# Patient Record
Sex: Female | Born: 1950
Health system: Southern US, Community
[De-identification: ages and names within clinical notes are randomized; demographics above are authoritative.]

## PROBLEM LIST (undated history)

## (undated) DIAGNOSIS — C801 Malignant (primary) neoplasm, unspecified: Secondary | ICD-10-CM

## (undated) DIAGNOSIS — I10 Essential (primary) hypertension: Secondary | ICD-10-CM

## (undated) DIAGNOSIS — D649 Anemia, unspecified: Secondary | ICD-10-CM

## (undated) HISTORY — DX: Essential (primary) hypertension: I10

---

## 1999-02-28 ENCOUNTER — Emergency Department (HOSPITAL_COMMUNITY): Admission: EM | Admit: 1999-02-28 | Discharge: 1999-02-28 | Payer: Self-pay | Admitting: Emergency Medicine

## 2001-09-28 ENCOUNTER — Encounter: Payer: Self-pay | Admitting: Emergency Medicine

## 2001-09-28 ENCOUNTER — Emergency Department (HOSPITAL_COMMUNITY): Admission: EM | Admit: 2001-09-28 | Discharge: 2001-09-28 | Payer: Self-pay | Admitting: Emergency Medicine

## 2002-04-07 ENCOUNTER — Encounter: Admission: RE | Admit: 2002-04-07 | Discharge: 2002-04-07 | Payer: Self-pay | Admitting: *Deleted

## 2002-05-11 ENCOUNTER — Ambulatory Visit (HOSPITAL_COMMUNITY): Admission: RE | Admit: 2002-05-11 | Discharge: 2002-05-11 | Payer: Self-pay | Admitting: *Deleted

## 2002-08-07 ENCOUNTER — Encounter: Payer: Self-pay | Admitting: Emergency Medicine

## 2002-08-07 ENCOUNTER — Emergency Department (HOSPITAL_COMMUNITY): Admission: EM | Admit: 2002-08-07 | Discharge: 2002-08-07 | Payer: Self-pay | Admitting: Emergency Medicine

## 2003-07-14 ENCOUNTER — Emergency Department (HOSPITAL_COMMUNITY): Admission: EM | Admit: 2003-07-14 | Discharge: 2003-07-14 | Payer: Self-pay | Admitting: Emergency Medicine

## 2006-06-18 ENCOUNTER — Ambulatory Visit (HOSPITAL_COMMUNITY): Admission: RE | Admit: 2006-06-18 | Discharge: 2006-06-18 | Payer: Self-pay | Admitting: Family Medicine

## 2007-02-01 ENCOUNTER — Emergency Department (HOSPITAL_COMMUNITY): Admission: EM | Admit: 2007-02-01 | Discharge: 2007-02-01 | Payer: Self-pay | Admitting: Emergency Medicine

## 2007-06-20 ENCOUNTER — Ambulatory Visit (HOSPITAL_COMMUNITY): Admission: RE | Admit: 2007-06-20 | Discharge: 2007-06-20 | Payer: Self-pay | Admitting: Family Medicine

## 2007-07-12 ENCOUNTER — Emergency Department (HOSPITAL_COMMUNITY): Admission: EM | Admit: 2007-07-12 | Discharge: 2007-07-12 | Payer: Self-pay | Admitting: Emergency Medicine

## 2007-09-11 ENCOUNTER — Encounter (INDEPENDENT_AMBULATORY_CARE_PROVIDER_SITE_OTHER): Payer: Self-pay | Admitting: Nurse Practitioner

## 2007-09-11 LAB — CONVERTED CEMR LAB: Pap Smear: NEGATIVE

## 2007-09-22 ENCOUNTER — Ambulatory Visit: Payer: Self-pay | Admitting: Nurse Practitioner

## 2007-09-22 DIAGNOSIS — I10 Essential (primary) hypertension: Secondary | ICD-10-CM | POA: Insufficient documentation

## 2007-09-22 DIAGNOSIS — J309 Allergic rhinitis, unspecified: Secondary | ICD-10-CM | POA: Insufficient documentation

## 2007-09-23 ENCOUNTER — Ambulatory Visit: Payer: Self-pay | Admitting: *Deleted

## 2007-10-13 ENCOUNTER — Encounter (INDEPENDENT_AMBULATORY_CARE_PROVIDER_SITE_OTHER): Payer: Self-pay | Admitting: Nurse Practitioner

## 2007-10-14 ENCOUNTER — Encounter (INDEPENDENT_AMBULATORY_CARE_PROVIDER_SITE_OTHER): Payer: Self-pay | Admitting: Nurse Practitioner

## 2007-10-14 LAB — CONVERTED CEMR LAB
Cholesterol: 182 mg/dL
Total CHOL/HDL Ratio: 3.1
Triglycerides: 69 mg/dL

## 2007-10-16 ENCOUNTER — Ambulatory Visit: Payer: Self-pay | Admitting: Nurse Practitioner

## 2007-10-16 LAB — CONVERTED CEMR LAB
Bilirubin Urine: NEGATIVE
Ketones, urine, test strip: NEGATIVE
Specific Gravity, Urine: 1.015

## 2007-10-17 ENCOUNTER — Encounter (INDEPENDENT_AMBULATORY_CARE_PROVIDER_SITE_OTHER): Payer: Self-pay | Admitting: Nurse Practitioner

## 2007-10-21 ENCOUNTER — Encounter (INDEPENDENT_AMBULATORY_CARE_PROVIDER_SITE_OTHER): Payer: Self-pay | Admitting: Nurse Practitioner

## 2007-10-28 ENCOUNTER — Encounter (INDEPENDENT_AMBULATORY_CARE_PROVIDER_SITE_OTHER): Payer: Self-pay | Admitting: Nurse Practitioner

## 2007-10-30 ENCOUNTER — Telehealth (INDEPENDENT_AMBULATORY_CARE_PROVIDER_SITE_OTHER): Payer: Self-pay | Admitting: Nurse Practitioner

## 2008-03-22 ENCOUNTER — Ambulatory Visit: Payer: Self-pay | Admitting: Nurse Practitioner

## 2008-03-22 DIAGNOSIS — R609 Edema, unspecified: Secondary | ICD-10-CM

## 2008-03-24 ENCOUNTER — Encounter (INDEPENDENT_AMBULATORY_CARE_PROVIDER_SITE_OTHER): Payer: Self-pay | Admitting: Nurse Practitioner

## 2008-03-24 DIAGNOSIS — D649 Anemia, unspecified: Secondary | ICD-10-CM

## 2008-03-24 DIAGNOSIS — D560 Alpha thalassemia: Secondary | ICD-10-CM | POA: Insufficient documentation

## 2008-03-24 LAB — CONVERTED CEMR LAB: Retic Ct Pct: 1.3 % (ref 0.4–3.1)

## 2008-03-25 ENCOUNTER — Encounter (INDEPENDENT_AMBULATORY_CARE_PROVIDER_SITE_OTHER): Payer: Self-pay | Admitting: Nurse Practitioner

## 2008-03-25 LAB — CONVERTED CEMR LAB
Albumin: 4.5 g/dL (ref 3.5–5.2)
Alkaline Phosphatase: 40 units/L (ref 39–117)
BUN: 21 mg/dL (ref 6–23)
CO2: 23 meq/L (ref 19–32)
Calcium: 9.5 mg/dL (ref 8.4–10.5)
Eosinophils Absolute: 0.1 10*3/uL (ref 0.0–0.7)
Glucose, Bld: 90 mg/dL (ref 70–99)
HCT: 28.8 % — ABNORMAL LOW (ref 36.0–46.0)
Lymphocytes Relative: 37 % (ref 12–46)
Lymphs Abs: 1.8 10*3/uL (ref 0.7–4.0)
MCV: 55.4 fL — ABNORMAL LOW (ref 78.0–100.0)
Monocytes Relative: 9 % (ref 3–12)
Neutrophils Relative %: 53 % (ref 43–77)
Platelets: 255 10*3/uL (ref 150–400)
Potassium: 4.6 meq/L (ref 3.5–5.3)
RBC: 5.2 M/uL — ABNORMAL HIGH (ref 3.87–5.11)
Sodium: 136 meq/L (ref 135–145)
TSH: 1.262 microintl units/mL (ref 0.350–4.50)
Total Protein: 7.5 g/dL (ref 6.0–8.3)
WBC: 4.8 10*3/uL (ref 4.0–10.5)

## 2008-04-02 ENCOUNTER — Encounter (INDEPENDENT_AMBULATORY_CARE_PROVIDER_SITE_OTHER): Payer: Self-pay | Admitting: *Deleted

## 2008-04-06 ENCOUNTER — Ambulatory Visit: Payer: Self-pay | Admitting: Nurse Practitioner

## 2008-04-06 DIAGNOSIS — J209 Acute bronchitis, unspecified: Secondary | ICD-10-CM | POA: Insufficient documentation

## 2008-04-06 LAB — CONVERTED CEMR LAB
Basophils Absolute: 0 10*3/uL (ref 0.0–0.1)
Eosinophils Relative: 3 % (ref 0–5)
HCT: 28.2 % — ABNORMAL LOW (ref 36.0–46.0)
Lymphocytes Relative: 37 % (ref 12–46)
Lymphs Abs: 1.8 10*3/uL (ref 0.7–4.0)
Neutro Abs: 2.6 10*3/uL (ref 1.7–7.7)
Neutrophils Relative %: 53 % (ref 43–77)
Platelets: 269 10*3/uL (ref 150–400)
RDW: 24 % — ABNORMAL HIGH (ref 11.5–15.5)
WBC: 4.8 10*3/uL (ref 4.0–10.5)

## 2008-04-07 ENCOUNTER — Encounter (INDEPENDENT_AMBULATORY_CARE_PROVIDER_SITE_OTHER): Payer: Self-pay | Admitting: Nurse Practitioner

## 2008-05-07 ENCOUNTER — Telehealth (INDEPENDENT_AMBULATORY_CARE_PROVIDER_SITE_OTHER): Payer: Self-pay | Admitting: Nurse Practitioner

## 2008-06-21 ENCOUNTER — Ambulatory Visit (HOSPITAL_COMMUNITY): Admission: RE | Admit: 2008-06-21 | Discharge: 2008-06-21 | Payer: Self-pay | Admitting: Family Medicine

## 2008-07-14 ENCOUNTER — Telehealth (INDEPENDENT_AMBULATORY_CARE_PROVIDER_SITE_OTHER): Payer: Self-pay | Admitting: Nurse Practitioner

## 2008-07-20 ENCOUNTER — Encounter (INDEPENDENT_AMBULATORY_CARE_PROVIDER_SITE_OTHER): Payer: Self-pay | Admitting: *Deleted

## 2008-08-05 ENCOUNTER — Ambulatory Visit: Payer: Self-pay | Admitting: Nurse Practitioner

## 2008-08-05 DIAGNOSIS — R21 Rash and other nonspecific skin eruption: Secondary | ICD-10-CM | POA: Insufficient documentation

## 2008-08-09 ENCOUNTER — Encounter (INDEPENDENT_AMBULATORY_CARE_PROVIDER_SITE_OTHER): Payer: Self-pay | Admitting: Nurse Practitioner

## 2008-08-09 LAB — CONVERTED CEMR LAB
HCT: 29.5 % — ABNORMAL LOW (ref 36.0–46.0)
MCV: 55.5 fL — ABNORMAL LOW (ref 78.0–100.0)
Platelets: 162 10*3/uL (ref 150–400)
RBC: 5.32 M/uL — ABNORMAL HIGH (ref 3.87–5.11)
WBC: 4.1 10*3/uL (ref 4.0–10.5)

## 2008-09-02 ENCOUNTER — Encounter (INDEPENDENT_AMBULATORY_CARE_PROVIDER_SITE_OTHER): Payer: Self-pay | Admitting: Nurse Practitioner

## 2009-06-22 ENCOUNTER — Ambulatory Visit (HOSPITAL_COMMUNITY): Admission: RE | Admit: 2009-06-22 | Discharge: 2009-06-22 | Payer: Self-pay | Admitting: Family Medicine

## 2009-06-22 ENCOUNTER — Encounter (INDEPENDENT_AMBULATORY_CARE_PROVIDER_SITE_OTHER): Payer: Self-pay | Admitting: Nurse Practitioner

## 2009-07-11 ENCOUNTER — Encounter: Admission: RE | Admit: 2009-07-11 | Discharge: 2009-07-11 | Payer: Self-pay | Admitting: Family Medicine

## 2009-07-11 ENCOUNTER — Encounter (INDEPENDENT_AMBULATORY_CARE_PROVIDER_SITE_OTHER): Payer: Self-pay | Admitting: Nurse Practitioner

## 2009-07-12 ENCOUNTER — Encounter (INDEPENDENT_AMBULATORY_CARE_PROVIDER_SITE_OTHER): Payer: Self-pay | Admitting: Nurse Practitioner

## 2009-07-15 ENCOUNTER — Encounter (INDEPENDENT_AMBULATORY_CARE_PROVIDER_SITE_OTHER): Payer: Self-pay | Admitting: Nurse Practitioner

## 2009-08-12 ENCOUNTER — Ambulatory Visit: Payer: Self-pay | Admitting: Nurse Practitioner

## 2009-09-16 ENCOUNTER — Ambulatory Visit: Payer: Self-pay | Admitting: Nurse Practitioner

## 2009-09-16 ENCOUNTER — Other Ambulatory Visit: Admission: RE | Admit: 2009-09-16 | Discharge: 2009-09-16 | Payer: Self-pay | Admitting: Internal Medicine

## 2009-09-22 ENCOUNTER — Encounter (INDEPENDENT_AMBULATORY_CARE_PROVIDER_SITE_OTHER): Payer: Self-pay | Admitting: Nurse Practitioner

## 2010-01-18 ENCOUNTER — Ambulatory Visit: Payer: Self-pay | Admitting: Nurse Practitioner

## 2010-01-18 DIAGNOSIS — M25569 Pain in unspecified knee: Secondary | ICD-10-CM | POA: Insufficient documentation

## 2010-06-21 ENCOUNTER — Ambulatory Visit: Payer: Self-pay | Admitting: Nurse Practitioner

## 2010-06-23 ENCOUNTER — Encounter (INDEPENDENT_AMBULATORY_CARE_PROVIDER_SITE_OTHER): Payer: Self-pay | Admitting: Nurse Practitioner

## 2010-06-23 DIAGNOSIS — M21619 Bunion of unspecified foot: Secondary | ICD-10-CM

## 2010-06-27 ENCOUNTER — Ambulatory Visit (HOSPITAL_COMMUNITY)
Admission: RE | Admit: 2010-06-27 | Discharge: 2010-06-27 | Payer: Self-pay | Source: Home / Self Care | Attending: Internal Medicine | Admitting: Internal Medicine

## 2010-07-23 ENCOUNTER — Encounter: Payer: Self-pay | Admitting: Family Medicine

## 2010-07-30 LAB — CONVERTED CEMR LAB
AST: 11 units/L (ref 0–37)
Alkaline Phosphatase: 47 units/L (ref 39–117)
BUN: 21 mg/dL (ref 6–23)
Basophils Absolute: 0 10*3/uL (ref 0.0–0.1)
Creatinine, Ser: 0.83 mg/dL (ref 0.40–1.20)
Eosinophils Absolute: 0.1 10*3/uL (ref 0.0–0.7)
Eosinophils Relative: 2 % (ref 0–5)
GC Probe Amp, Genital: NEGATIVE
Glucose, Urine, Semiquant: NEGATIVE
HCT: 31.8 % — ABNORMAL LOW (ref 36.0–46.0)
HDL: 50 mg/dL (ref >39)
KOH Prep: NEGATIVE
Ketones, urine, test strip: NEGATIVE
LDL Cholesterol: 130 mg/dL — ABNORMAL HIGH (ref 0–99)
MCV: 54.3 fL — ABNORMAL LOW (ref 78.0–100.0)
Neutrophils Relative %: 52 % (ref 43–77)
Nitrite: NEGATIVE
OCCULT 1: NEGATIVE
Platelets: 172 10*3/uL (ref 150–400)
Protein, U semiquant: NEGATIVE
RDW: 25.1 % — ABNORMAL HIGH (ref 11.5–15.5)
Rapid HIV Screen: NEGATIVE
Specific Gravity, Urine: >=1.03
TSH: 2.299 microintl units/mL (ref 0.350–4.500)
Total Bilirubin: 1.2 mg/dL (ref 0.3–1.2)
Total CHOL/HDL Ratio: 3.9
VLDL: 14 mg/dL (ref 0–40)
pH: 5

## 2010-08-01 ENCOUNTER — Telehealth (INDEPENDENT_AMBULATORY_CARE_PROVIDER_SITE_OTHER): Payer: Self-pay | Admitting: Nurse Practitioner

## 2010-08-03 NOTE — Assessment & Plan Note (Signed)
Summary: Discoloration of legs   Vital Signs:  Patient profile:   60 year old female Menstrual status:  postmenopausal Weight:      246.6 pounds BMI:     42.48 BSA:     2.14 Temp:     98.3 degrees F oral Pulse rate:   58 / minute Pulse rhythm:   regular Resp:     16 per minute BP sitting:   130 / 77  (left arm) Cuff size:   regular  Vitals Entered By: Levon Hedger (August 12, 2009 3:58 PM) CC: both feet burning and she says that she needs some fluid pill....discoloration on legs Is Patient Diabetic? No Pain Assessment Patient in pain? no       Does patient need assistance? Functional Status Self care Ambulation Normal     Menstrual Status postmenopausal Last PAP Result negative   CC:  both feet burning and she says that she needs some fluid pill....discoloration on legs.  History of Present Illness:  Pt into the office with complaints of her legs being discolored. Upon review of chart pt had this same problems this very time last year. She reported that problem self resolved after 2-3 weeks after his visit here. Discoloration returned about 1 month ago. No pain slight itching behind her knee Some edema at baseline but this is normal for pt Left leg is only affected at this time.  admits that she sits very close to her kerosone heater  and the source is directly pointed on her left side Denies any trauma to her legs  Anticoagulation Management History:      Positive risk factors for bleeding include presence of serious comorbidities.  Negative risk factors for bleeding include an age less than 54 years old and no history of CVA/TIA.  The bleeding index is 'intermediate risk'.  Positive CHADS2 values include History of HTN.  Negative CHADS2 values include Age > 33 years old and Prior Stroke/CVA/TIA.     Habits & Providers  Alcohol-Tobacco-Diet     Alcohol drinks/day: 0     Tobacco Status: never  Exercise-Depression-Behavior     Does Patient Exercise:  no     Have you felt down or hopeless? no     Have you felt little pleasure in things? no     Depression Counseling: not indicated; screening negative for depression     Drug Use: no     Seat Belt Use: 100     Sun Exposure: occasionally  Allergies (verified): No Known Drug Allergies  Social History: Does Patient Exercise:  no  Review of Systems CV:  Complains of swelling of feet; denies chest pain or discomfort. Resp:  Denies cough. GI:  Denies abdominal pain, nausea, and vomiting. Derm:  discoloration of legs.  Physical Exam  General:  alert.  obese Head:  normocephalic.   Lungs:  normal breath sounds.   Heart:  normal rate and regular rhythm.   Abdomen:  soft and non-tender.   Msk:  up to the exam table Extremities:  trace left pedal edema.   Neurologic:  alert & oriented X3.   Skin:  left lower extremity - blotches of discoloration blanches when touched Psych:  Oriented X3.     Impression & Recommendations:  Problem # 1:  SKIN RASH (ICD-782.1) advised that rash is likely due to sitting close to the heater advised pt that symptoms will likely resolve if she avoids direct contact with the source  Complete Medication List: 1)  Loratadine 10  Mg Tabs (Loratadine) .Marland Kitchen.. 1 tablet by mouth daily as needed for allergies 2)  Lisinopril-hydrochlorothiazide 20-12.5 Mg Tabs (Lisinopril-hydrochlorothiazide) .Marland Kitchen.. 1 tablet by mouth daily 3)  Furosemide 20 Mg Tabs (Furosemide) .Marland Kitchen.. 1 tablet by mouth daily as needed for fluid 4)  Ferrous Sulfate 325 (65 Fe) Mg Tbec (Ferrous sulfate) .Marland Kitchen.. 1 tablet by mouth two times a day to build up blood 5)  Flonase 50 Mcg/act Susp (Fluticasone propionate) .Marland Kitchen.. 1 spray in each nostril two times a day  Patient Instructions: 1)  Schedule an appointment for a complete physical exam. 2)  Come fasting for labs.  Do not eat after midnight before this appointment.

## 2010-08-03 NOTE — Letter (Signed)
Summary: Internal Correspondence - breast center  Internal Correspondence - breast center   Imported By: Paula Libra 07/15/2009 16:51:48  _____________________________________________________________________  External Attachment:    Type:   Image     Comment:   External Document

## 2010-08-03 NOTE — Assessment & Plan Note (Signed)
Summary: HTN   Vital Signs:  Patient profile:   60 year old female Menstrual status:  postmenopausal Weight:      240.0 pounds BMI:     44.06 Temp:     98.1 degrees F oral Pulse rate:   60 / minute Pulse rhythm:   regular Resp:     16 per minute BP sitting:   130 / 60  (left arm) Cuff size:   regular  Vitals Entered By: Levon Hedger (June 23, 2010 9:42 AM)  Nutrition Counseling: Patient's BMI is greater than 25 and therefore counseled on weight management options. CC: right foot has been burning when she is relaxing...right knee pain with cramps in her side and hips alot that comes and goes, Hypertension Management Is Patient Diabetic? No Pain Assessment Patient in pain? no       Does patient need assistance? Functional Status Self care Ambulation Normal   CC:  right foot has been burning when she is relaxing...right knee pain with cramps in her side and hips alot that comes and goes and Hypertension Management.  History of Present Illness:  Pt into the office for 4 months - HTN  Hypertension History:      She denies headache, chest pain, and palpitations.        Positive major cardiovascular risk factors include female age 45 years old or older and hypertension.  Negative major cardiovascular risk factors include no history of diabetes or hyperlipidemia, negative family history for ischemic heart disease, and non-tobacco-user status.        Further assessment for target organ damage reveals no history of ASHD, cardiac end-organ damage (CHF/LVH), stroke/TIA, peripheral vascular disease, renal insufficiency, or hypertensive retinopathy.     Habits & Providers  Alcohol-Tobacco-Diet     Alcohol drinks/day: 0     Tobacco Status: never  Exercise-Depression-Behavior     Does Patient Exercise: yes     Exercise Counseling: not indicated; exercise is adequate     Type of exercise: walking     Exercise (avg: min/session): 30-60     Depression Counseling: not  indicated; screening negative for depression     Drug Use: no     Seat Belt Use: 100     Sun Exposure: occasionally  Medications Prior to Update: 1)  Lisinopril-Hydrochlorothiazide 20-12.5 Mg Tabs (Lisinopril-Hydrochlorothiazide) .... One Tablet By Mouth Daily For Blood Pressure 2)  Furosemide 20 Mg Tabs (Furosemide) .Marland Kitchen.. 1 Tablet By Mouth Daily As Needed For Fluid 3)  Ferrous Sulfate 325 (65 Fe) Mg Tbec (Ferrous Sulfate) .Marland Kitchen.. 1 Tablet By Mouth Two Times A Day To Build Up Blood 4)  Bayer Low Strength 81 Mg Tbec (Aspirin) .... One Tablet By Mouth Daily 5)  Naproxen 500 Mg Tabs (Naproxen) .... One Tablet By Mouth Two Times A Day As Needed For Knee Pain  Current Medications (verified): 1)  Lisinopril-Hydrochlorothiazide 20-12.5 Mg Tabs (Lisinopril-Hydrochlorothiazide) .... One Tablet By Mouth Daily For Blood Pressure 2)  Furosemide 20 Mg Tabs (Furosemide) .Marland Kitchen.. 1 Tablet By Mouth Daily As Needed For Fluid 3)  Ferrous Sulfate 325 (65 Fe) Mg Tbec (Ferrous Sulfate) .Marland Kitchen.. 1 Tablet By Mouth Two Times A Day To Build Up Blood 4)  Bayer Low Strength 81 Mg Tbec (Aspirin) .... One Tablet By Mouth Daily 5)  Naproxen 500 Mg Tabs (Naproxen) .... One Tablet By Mouth Two Times A Day As Needed For Knee Pain  Allergies (verified): No Known Drug Allergies  Review of Systems CV:  Denies chest pain or discomfort.  Resp:  Denies cough. GI:  Denies abdominal pain, nausea, and vomiting. MS:  Complains of joint pain; right great toe right knee - "Sometimes gives away while working".  Physical Exam  General:  alert.   Head:  normocephalic.   Ears:  ear piercing(s) noted.   Lungs:  normal breath sounds.   Heart:  normal rate and regular rhythm.   Abdomen:  normal bowel sounds.  obsese Msk:  bunion on right foot - great toe Neurologic:  alert & oriented X3.     Impression & Recommendations:  Problem # 1:  BUNION, RIGHT FOOT (ICD-727.1) handout given to pt advised pt to wear shoes that are  comfortable  Problem # 2:  HYPERTENSION, BENIGN ESSENTIAL (ICD-401.1) DASH diet  keep taking meds as ordered Her updated medication list for this problem includes:    Lisinopril-hydrochlorothiazide 20-12.5 Mg Tabs (Lisinopril-hydrochlorothiazide) ..... One tablet by mouth daily for blood pressure    Furosemide 20 Mg Tabs (Furosemide) .Marland Kitchen... 1 tablet by mouth daily as needed for fluid  Problem # 3:  KNEE PAIN (ICD-719.46) advised inflammatories pt needs support to knee Her updated medication list for this problem includes:    Bayer Low Strength 81 Mg Tbec (Aspirin) ..... One tablet by mouth daily    Naproxen 500 Mg Tabs (Naproxen) ..... One tablet by mouth two times a day as needed for knee pain  Complete Medication List: 1)  Lisinopril-hydrochlorothiazide 20-12.5 Mg Tabs (Lisinopril-hydrochlorothiazide) .... One tablet by mouth daily for blood pressure 2)  Furosemide 20 Mg Tabs (Furosemide) .Marland Kitchen.. 1 tablet by mouth daily as needed for fluid 3)  Ferrous Sulfate 325 (65 Fe) Mg Tbec (Ferrous sulfate) .Marland Kitchen.. 1 tablet by mouth two times a day to build up blood 4)  Bayer Low Strength 81 Mg Tbec (Aspirin) .... One tablet by mouth daily 5)  Naproxen 500 Mg Tabs (Naproxen) .... One tablet by mouth two times a day as needed for knee pain  Hypertension Assessment/Plan:      The patient's hypertensive risk group is category B: At least one risk factor (excluding diabetes) with no target organ damage.  Her calculated 10 year risk of coronary heart disease is 9 %.  Today's blood pressure is 130/60.  Her blood pressure goal is < 140/90.  Patient Instructions: 1)  Right foot - similar to bunion.  2)  The bones in your feet have changes shape over time. 3)  you can put heat on this area when it becomes red and inflammed     4)  Blood pressure - doing well  5)  Will refill medications 6)  Right knee pain - may take naprosyn as needed for pain.   7)  Keep up the efforts at exercise. 8)  Medications have  been [facility] to [facility] 9)  You have declined the flu vaccine today.  If you change you mind then notify this office 10)  Follow up in  4 months for blood pressure. Prescriptions: NAPROXEN 500 MG TABS (NAPROXEN) One tablet by mouth two times a day as needed for knee pain  #50 x 0   Entered and Authorized by:   Lehman Prom FNP   Signed by:   Lehman Prom FNP on 06/23/2010   Method used:   Faxed to ...       [facility] (retail)       [address] [place], [place]  [address]  Ph: [phone]       Fax: [phone]   RxID:   [id number] FUROSEMIDE 20 MG TABS (FUROSEMIDE) 1 tablet by mouth daily as needed for fluid  #30 x 5   Entered and Authorized by:   Lehman Prom FNP   Signed by:   Lehman Prom FNP on 06/23/2010   Method used:   Faxed to ...       [facility] - [facility] (retail)       [address] [place], [place]  [address]       Ph: [phone]       Fax: [phone]   RxID:   [id number] LISINOPRIL-HYDROCHLOROTHIAZIDE 20-12.5 MG TABS (LISINOPRIL-HYDROCHLOROTHIAZIDE) One tablet by mouth daily for blood pressure  #30 x 5   Entered and Authorized by:   Lehman Prom FNP   Signed by:   Lehman Prom FNP on 06/23/2010   Method used:   Faxed to ...       [facility] - Pharmac (retail)       [address] [place], [place]  [address]       Ph: [phone]       Fax: [phone]   RxID:   [id number]    Orders Added: 1)  Est. Patient Level III [15[address]]    Prevention & Chronic Care Immunizations   Influenza vaccine: Not documented   Influenza vaccine deferral: Refused  (06/23/2010)    Tetanus booster: 09/19/2009: Tdap   Td booster deferral: Not available  (09/16/2009)    Pneumococcal vaccine: Not documented    H. zoster vaccine: Not documented  Colorectal Screening   Hemoccult: Not documented    Hemoccult action/deferral: Ordered  (09/16/2009)   Hemoccult due: 09/17/2010    Colonoscopy: Not documented  Other Screening   Pap smear:  Specimen Adequacy: Satisfactory for evaluation.   Interpretation/Result:Negative for intraepithelial Lesion or Malignancy.     (09/16/2009)   Pap smear action/deferral: Ordered  (09/16/2009)   Pap smear due: 10/2010    Mammogram: Possible mass, right breast. Additional evaluation is indicated.  The pt will be contacted for additional studies and a supplementary report will follow.  No specific mammographic evidence of malignancy, left breast  (06/22/2009)   Mammogram action/deferral: Further imaging of the right breast  (06/22/2009)   Mammogram due: 06/22/2010    DXA bone density scan: Not documented   Smoking status: never  (06/23/2010)  Lipids   Total Cholesterol: 194  (09/16/2009)   Lipid panel action/deferral: Lipid Panel ordered   LDL: 130  (09/16/2009)   LDL Direct: Not documented   HDL: 50  (09/16/2009)   Triglycerides: 72  (09/16/2009)  Hypertension   Last Blood Pressure: 130 / 60  (06/23/2010)   Serum creatinine: 0.83  (09/16/2009)   BMP action: Ordered   Serum potassium 4.8  (09/16/2009)  Self-Management Support :    Hypertension self-management support: Not documented

## 2010-08-03 NOTE — Assessment & Plan Note (Signed)
Summary: Complete Physical Exam   Vital Signs:  Patient profile:   60 year old female Menstrual status:  postmenopausal Weight:      248 pounds BMI:     4.27 BSA:     0.81 Temp:     98.3 degrees F oral Pulse rate:   60 / minute Pulse rhythm:   regular Resp:     16 per minute BP sitting:   142 / 81  (left arm) Cuff size:   regular  Vitals Entered By: Levon Hedger (September 16, 2009 3:47 PM) CC: CPP....feet pain and burning, Hypertension Management Is Patient Diabetic? No Pain Assessment Patient in pain? no       Does patient need assistance? Functional Status Self care Ambulation Normal   CC:  CPP....feet pain and burning and Hypertension Management.  History of Present Illness:  Pt into the office for a complete physical exam.  PAP - No hx of abnormal paps, last pap smear about 2 years ago Postmenopausal - last menses at age 63 No family hx of cervical or ovarian CA  Mammogram - last done 06/22/2009 No family hx of breast cancer.  Self breast exams at home while in the shower.  Optho - wears reading glasses.  last eye exam was on 2005.  Dental - no recent dental exam. Pt aware that she needs some dental care.  Tdap - last done over 10 years ago.  PPD given last year for work  Colonscopy - pt has never had a screening colonscopy.  She had an older sister who died at age 80 of colon cancer.  Normal bowel habits, daily.  No blood noted in stool.  Hypertension History:      She denies headache, chest pain, and palpitations.  She notes no problems with any antihypertensive medication side effects.  Pt has not taken her diuretic in over 1 month.        Positive major cardiovascular risk factors include female age 44 years old or older and hypertension.  Negative major cardiovascular risk factors include negative family history for ischemic heart disease and non-tobacco-user status.        Further assessment for target organ damage reveals no history of ASHD, cardiac  end-organ damage (CHF/LVH), stroke/TIA, peripheral vascular disease, renal insufficiency, or hypertensive retinopathy.     Habits & Providers  Alcohol-Tobacco-Diet     Alcohol drinks/day: 0     Tobacco Status: never  Exercise-Depression-Behavior     Does Patient Exercise: no     Depression Counseling: not indicated; screening negative for depression     Drug Use: no     Seat Belt Use: 100     Sun Exposure: occasionally  Comments: PGQ-9 score = 4  Allergies (verified): No Known Drug Allergies  Review of Systems General:  Denies fever. Eyes:  Denies discharge. ENT:  Denies earache. CV:  Denies chest pain or discomfort. Resp:  Denies cough. GI:  Denies bloody stools. GU:  Denies discharge. MS:  Complains of cramps; denies joint pain; in calf muscles at times. Derm:  Denies rash. Neuro:  Complains of tingling; bottom of feet tingling for the past 2 months. not daily but some days are worse than others and usually it worse at night and particularly if she walked a lot during the day. Psych:  Denies depression.  Physical Exam  General:  alert.   Head:  normocephalic.   Eyes:  vision grossly intact, pupils equal, and pupils round.   Ears:  bil TM  with clear fluid Nose:  no nasal discharge.   Mouth:  pharynx pink and moist and poor dentition.   Neck:  supple.   Chest Wall:  no mass.   Breasts:  skin/areolae normal and no masses.   Lungs:  normal breath sounds.   Heart:  normal rate and regular rhythm.   Abdomen:  soft, non-tender, and normal bowel sounds.   Rectal:  no external abnormalities.   Msk:  up to the exam table Pulses:  R radial normal, R dorsalis pedis normal, L radial normal, and L dorsalis pedis normal.   Extremities:  no edema but pt has large leg girth Neurologic:  alert & oriented X3.   Skin:  color normal.   Psych:  Oriented X3.    Pelvic Exam  Vulva:      normal appearance.   Urethra and Bladder:      Urethra--normal.   Vagina:       physiologic discharge.   Cervix:      midposition.   Adnexa:      nontender bilaterally.   Rectum:      normal, heme negative stool.      Impression & Recommendations:  Problem # 1:  ROUTINE GYNECOLOGICAL EXAMINATION (ICD-V72.31) labs done PAP done EKG done guaiac negative rec optho and dental exam tdap given PHQ-9 = 4 advised pt that she needs screening colonscopy especially given her family history and she has never had one before Orders: Hemoccult Guaiac-1 spec.(in office) (82270) KOH/ WET Mount [phone]) Pap Smear, Thin Prep ( Collection of) (Q0091) T- GC Chlamydia (60454)  Problem # 2:  HYPERTENSION, BENIGN ESSENTIAL (ICD-401.1) DASH diet slightly elevated today but may be in part due to not having diuretic will restart lasix Her updated medication list for this problem includes:    Lisinopril-hydrochlorothiazide 20-12.5 Mg Tabs (Lisinopril-hydrochlorothiazide) ..... One tablet by mouth daily for blood pressure    Furosemide 20 Mg Tabs (Furosemide) .Marland Kitchen... 1 tablet by mouth daily as needed for fluid  Problem # 3:  ANEMIA (ICD-285.9) will check labs today Her updated medication list for this problem includes:    Ferrous Sulfate 325 (65 Fe) Mg Tbec (Ferrous sulfate) .Marland Kitchen... 1 tablet by mouth two times a day to build up blood  Problem # 4:  OBESITY (ICD-278.00) advised again that pt needs to lose weight and make better diet choices  Complete Medication List: 1)  Lisinopril-hydrochlorothiazide 20-12.5 Mg Tabs (Lisinopril-hydrochlorothiazide) .... One tablet by mouth daily for blood pressure 2)  Furosemide 20 Mg Tabs (Furosemide) .Marland Kitchen.. 1 tablet by mouth daily as needed for fluid 3)  Ferrous Sulfate 325 (65 Fe) Mg Tbec (Ferrous sulfate) .Marland Kitchen.. 1 tablet by mouth two times a day to build up blood  Other Orders: T-Urine Microalbumin w/creat. ratio [phone]) UA Dipstick w/o Micro (manual) (21308) T-Blood Typing ,ABO (65784-69629) T-Lipid Profile  (52841-32440) T-Comprehensive Metabolic Panel (10272-53664) T-CBC w/Diff (40347-42595) Rapid HIV  (63875) T-TSH (64332-95188) EKG w/ Interpretation (93000)  Hypertension Assessment/Plan:      The patient's hypertensive risk group is category B: At least one risk factor (excluding diabetes) with no target organ damage.  Her calculated 10 year risk of coronary heart disease is 11 %.  Today's blood pressure is 142/81.  Her blood pressure goal is < 140/90.  Patient Instructions: 1)  Your labs will be checked today and you will be notified of the results 2)  Leg cramps - be sure you eat foods with potassium to prevent cramps that come from taking the  fluid pills 3)  Burining in feet - likely when you have walked for long periods of time.  Be sure you have shoes with good supports. You can also wear support hose. 4)  You have received the tetanus today.  You will not need another for 10 years. 5)  Follow up in 6 months or sooner if needed.  Will need height. Prescriptions: FERROUS SULFATE 325 (65 FE) MG TBEC (FERROUS SULFATE) 1 tablet by mouth two times a day to build up blood  #60 x 5   Entered and Authorized by:   Lehman Prom FNP   Signed by:   Lehman Prom FNP on 09/16/2009   Method used:   Faxed to ...       [facility] - Pharmac (retail)       [address] [place], [place]  [address]       Ph: [phone]       Fax: [phone]   RxID:   [id number] FUROSEMIDE 20 MG TABS (FUROSEMIDE) 1 tablet by mouth daily as needed for fluid  #30 x 5   Entered and Authorized by:   Lehman Prom FNP   Signed by:   Lehman Prom FNP on 09/16/2009   Method used:   Faxed to ...       [facility] - Pharmac (retail)       [address] [place], [place]  [address]       Ph: [phone]       Fax: [phone]   RxID:   [id number] LISINOPRIL-HYDROCHLOROTHIAZIDE 20-12.5 MG TABS (LISINOPRIL-HYDROCHLOROTHIAZIDE)  One tablet by mouth daily for blood pressure  #30 x 5   Entered and Authorized by:   Lehman Prom FNP   Signed by:   Lehman Prom FNP on 09/16/2009   Method used:   Faxed to ...       [facility] - Pharmac (retail)       [address] [place], [place]  [address]       Ph: [phone]       Fax: [phone]   RxID:   [id number]   Prevention & Chronic Care Immunizations   Influenza vaccine: Not documented    Tetanus booster: Not documented   Td booster deferral: Not available  (09/16/2009)    Pneumococcal vaccine: Not documented  Colorectal Screening   Hemoccult: Not documented   Hemoccult action/deferral: Ordered  (09/16/2009)   Hemoccult due: 09/17/2010    Colonoscopy: Not documented  Other Screening   Pap smear: negative  (09/11/2007)   Pap smear action/deferral: Ordered  (09/16/2009)   Pap smear due: 09/17/2010    Mammogram: Possible mass, right breast. Additional evaluation is indicated.  The pt will be contacted for additional studies and a supplementary report will follow.  No specific mammographic evidence of malignancy, left breast  (06/22/2009)   Mammogram action/deferral: Further imaging of the right breast  (06/22/2009)   Mammogram due: 06/22/2010   Smoking status: never  (09/16/2009)  Lipids   Total Cholesterol: 182  (10/14/2007)   Lipid panel action/deferral: Lipid Panel ordered   LDL: 110  (10/14/2007)   LDL Direct: Not documented   HDL: 58  (10/14/2007)   Triglycerides: 69  (10/14/2007)  Hypertension   Last Blood Pressure: 142 / 81  (09/16/2009)   Serum creatinine: 1.01  (03/22/2008)   BMP action: Ordered  Serum potassium 4.6  (03/22/2008) CMP ordered   Self-Management Support :    Hypertension self-management support: Not documented   Laboratory Results   Urine Tests  Date/Time Received: September 16, 2009 5:27 PM   Routine Urinalysis   Color: lt. yellow Glucose: negative   (Normal  Range: Negative) Bilirubin: negative   (Normal Range: Negative) Ketone: negative   (Normal Range: Negative) Spec. Gravity: >=1.030   (Normal Range: 1.003-1.035) Blood: trace-lysed   (Normal Range: Negative) pH: 5.0   (Normal Range: 5.0-8.0) Protein: negative   (Normal Range: Negative) Urobilinogen: 0.2   (Normal Range: 0-1) Nitrite: negative   (Normal Range: Negative) Leukocyte Esterace: trace   (Normal Range: Negative)    Date/Time Received: September 16, 2009 5:27 PM   Wet [place] Source: vaginal WBC/hpf: 1-5 Bacteria/hpf: rare Clue cells/hpf: none Yeast/hpf: none Wet [place] KOH: Negative Trichomonas/hpf: none  Other Tests  Rapid HIV: negative  Stool - Occult Blood Hemmoccult #1: negative Date: 09/16/2009     EKG  Procedure date:  09/16/2009  Findings:      Sinus bradycardia with rate of:  56   Appended Document: Complete Physical Exam   Tetanus/Td Vaccine    Vaccine Type: Tdap    Site: right deltoid    Mfr: Sanofi Pasteur    Dose: 0.5 ml    Route: IM    Given by: Levon Hedger    Exp. Date: 10/07/2011    Lot #: [id number]    VIS given: 05/20/07 version given September 19, 2009.

## 2010-08-03 NOTE — Progress Notes (Signed)
Summary: Office Visit//DEPRESSION SCREENING  Office Visit//DEPRESSION SCREENING   Imported By: Arta Bruce 11/22/2009 14:26:02  _____________________________________________________________________  External Attachment:    Type:   Image     Comment:   External Document

## 2010-08-03 NOTE — Letter (Signed)
Summary: *HSN Results Follow up  [facility]  [address] [place], [place] [address]   Phone: [phone]  Fax: [phone]      09/22/2009   [name] [address] [place], [place]  [address]   Dear  [name],                            ____S.Drinkard,FNP   ____D. Gore,FNP       ____B. McPherson,MD   ____V. Rankins,MD    ____E. Mulberry,MD    __X__N. Daphine Deutscher, FNP  ____D. Reche Dixon, MD    ____K. Philipp Deputy, MD    ____Other     This letter is to inform you that your recent test(s):  ___X____Pap Smear    _______Lab Test     _______X-ray    ___X____ is within acceptable limits  _______ requires a medication change  _______ requires a follow-up lab visit  _______ requires a follow-up visit with your provider   Comments: Pap Smear results normal.       _________________________________________________________ If you have any questions, please contact our office [phone].                    Sincerely,    Lehman Prom FNP [facility]

## 2010-08-03 NOTE — Assessment & Plan Note (Signed)
Summary: Knee pain   Vital Signs:  Patient profile:   60 year old female Menstrual status:  postmenopausal Height:      62 inches Weight:      241.4 pounds BMI:     44.31 Pulse rate:   75 / minute Pulse rhythm:   regular Resp:     20 per minute BP sitting:   120 / 70  (left arm) Cuff size:   regular  Vitals Entered By: Levon Hedger (January 18, 2010 2:12 PM) CC: follow-up visit 3 month....pt has been experiencing alot of left knee pain and aching in her foot and ankle, Hypertension Management Is Patient Diabetic? No Pain Assessment Patient in pain? yes     Location: knee Intensity: 8  Does patient need assistance? Ambulation Normal   CC:  follow-up visit 3 month....pt has been experiencing alot of left knee pain and aching in her foot and ankle and Hypertension Management.  History of Present Illness:  Pt into the office with c/o of knee pain. Pt has been walking every morning. Pt is walking about 1 mile every morning. Down 7 pounds since her last visit.   Bottom of feet cramp and burning, pt had similar complaints during last visit labs done during last visit normal.  Presents today with her medications     Hypertension History:      She denies headache, chest pain, and palpitations.  She notes no problems with any antihypertensive medication side effects.        Positive major cardiovascular risk factors include female age 28 years old or older and hypertension.  Negative major cardiovascular risk factors include negative family history for ischemic heart disease and non-tobacco-user status.        Further assessment for target organ damage reveals no history of ASHD, cardiac end-organ damage (CHF/LVH), stroke/TIA, peripheral vascular disease, renal insufficiency, or hypertensive retinopathy.     Habits & Providers  Alcohol-Tobacco-Diet     Alcohol drinks/day: 0     Tobacco Status: never  Exercise-Depression-Behavior     Does Patient Exercise: yes  Exercise Counseling: not indicated; exercise is adequate     Type of exercise: walking     Exercise (avg: min/session): 30-60     Depression Counseling: not indicated; screening negative for depression     Drug Use: no     Seat Belt Use: 100     Sun Exposure: occasionally  Medications Prior to Update: 1)  Lisinopril-Hydrochlorothiazide 20-12.5 Mg Tabs (Lisinopril-Hydrochlorothiazide) .... One Tablet By Mouth Daily For Blood Pressure 2)  Furosemide 20 Mg Tabs (Furosemide) .Marland Kitchen.. 1 Tablet By Mouth Daily As Needed For Fluid 3)  Ferrous Sulfate 325 (65 Fe) Mg Tbec (Ferrous Sulfate) .Marland Kitchen.. 1 Tablet By Mouth Two Times A Day To Build Up Blood  Allergies (verified): No Known Drug Allergies  Social History: Does Patient Exercise:  yes  Review of Systems CV:  Complains of swelling of feet; denies chest pain or discomfort; improved and pt is taking lasix every other day. Resp:  Denies cough. MS:  Complains of joint pain; L>R. Neuro:  Complains of tingling; not every night but under the bottom of her foot tingling about 3 our of 7 nights admits that tingling has improved as she has started to improve her exercise routine.  Physical Exam  General:  alert.  obese Head:  normocephalic.   Mouth:  fair dentition.   Lungs:  no accessory muscle use.   Heart:  normal rate and regular rhythm.  Msk:  up to the exam table  Neurologic:  alert & oriented X3.     Knee Exam  General:    obese.    Knee Exam:    Left:    Inspection:  Abnormal    Palpation:  Normal    Stability:  stable    Tenderness:  no    Swelling:  no    Erythema:  no    arthritic changes   Impression & Recommendations:  Problem # 1:  KNEE PAIN (ICD-719.46) advise pt that knee pain is most likely due to increased walking Her updated medication list for this problem includes:    Bayer Low Strength 81 Mg Tbec (Aspirin) ..... One tablet by mouth daily    Naproxen 500 Mg Tabs (Naproxen) ..... One tablet by mouth two times  a day as needed for knee pain  Problem # 2:  OBESITY (ICD-278.00) down 7 pounds since her last visit  she has increased her exercise  Problem # 3:  HYPERTENSION, BENIGN ESSENTIAL (ICD-401.1) BP is stable Her updated medication list for this problem includes:    Lisinopril-hydrochlorothiazide 20-12.5 Mg Tabs (Lisinopril-hydrochlorothiazide) ..... One tablet by mouth daily for blood pressure    Furosemide 20 Mg Tabs (Furosemide) .Marland Kitchen... 1 tablet by mouth daily as needed for fluid  Problem # 4:  EDEMA (ICD-782.3) stable advised pt to try to take meds every 3rd day Her updated medication list for this problem includes:    Lisinopril-hydrochlorothiazide 20-12.5 Mg Tabs (Lisinopril-hydrochlorothiazide) ..... One tablet by mouth daily for blood pressure    Furosemide 20 Mg Tabs (Furosemide) .Marland Kitchen... 1 tablet by mouth daily as needed for fluid  Complete Medication List: 1)  Lisinopril-hydrochlorothiazide 20-12.5 Mg Tabs (Lisinopril-hydrochlorothiazide) .... One tablet by mouth daily for blood pressure 2)  Furosemide 20 Mg Tabs (Furosemide) .Marland Kitchen.. 1 tablet by mouth daily as needed for fluid 3)  Ferrous Sulfate 325 (65 Fe) Mg Tbec (Ferrous sulfate) .Marland Kitchen.. 1 tablet by mouth two times a day to build up blood 4)  Bayer Low Strength 81 Mg Tbec (Aspirin) .... One tablet by mouth daily 5)  Naproxen 500 Mg Tabs (Naproxen) .... One tablet by mouth two times a day as needed for knee pain  Hypertension Assessment/Plan:      The patient's hypertensive risk group is category B: At least one risk factor (excluding diabetes) with no target organ damage.  Her calculated 10 year risk of coronary heart disease is 9 %.  Today's blood pressure is 120/70.  Her blood pressure goal is < 140/90.  Patient Instructions: 1)  Schedule next follow up appointment in December 2011 or sooner if necessary. 2)  Continue to take Aspirin daily. 3)  The pain in your feet may be related to circulation which as you can see is improving  since you have started to walk. 4)  May take Naprosyn 500mg   by mouth two times a day as needed for pain and foot tingling Prescriptions: NAPROXEN 500 MG TABS (NAPROXEN) One tablet by mouth two times a day as needed for knee pain  #50 x 0   Entered and Authorized by:   Lehman Prom FNP   Signed by:   Lehman Prom FNP on 01/18/2010   Method used:   Print then Give to Patient   RxID:   [id number]

## 2010-08-03 NOTE — Letter (Signed)
Summary: Handout Printed  Printed Handout:  - Bunion

## 2010-08-03 NOTE — Letter (Signed)
Summary: Handout Printed  Printed Handout:  - Bunion 

## 2010-08-03 NOTE — Miscellaneous (Signed)
Summary: Mammogram results  Clinical Lists Changes  Observations: Added new observation of MAMMRECACT: Further imaging of the right breast (06/22/2009 13:08) Added new observation of MAMMOGRAM: Possible mass, right breast. Additional evaluation is indicated.  The pt will be contacted for additional studies and a supplementary report will follow.  No specific mammographic evidence of malignancy, left breast (06/22/2009 13:08)      Mammogram  Procedure date:  06/22/2009  Findings:      Possible mass, right breast. Additional evaluation is indicated.  The pt will be contacted for additional studies and a supplementary report will follow.  No specific mammographic evidence of malignancy, left breast  Comments:      Further imaging of the right breast   Mammogram  Procedure date:  06/22/2009  Findings:      Possible mass, right breast. Additional evaluation is indicated.  The pt will be contacted for additional studies and a supplementary report will follow.  No specific mammographic evidence of malignancy, left breast  Comments:      Further imaging of the right breast

## 2010-08-09 NOTE — Progress Notes (Signed)
Summary: Cold symptoms  Phone Note Call from Patient   Summary of Call: pt is calling because she has a chest cold that started yesterday.... pt says she tried tyenlol.... pt says she has a sore thoart.... pt denies fever.. pt says she is eating well and drinking fluids well... pt says she is coughing alot... pt says yellowish phelm... pt has a runny nose also.... pt says she used a heating pad for her chest because it was sore from coughing so much.. Initial call taken by: [place] Shannon,  August 01, 2010 11:53 AM  Follow-up for Phone Call        Denies breathing difficulty, fever, headache, nasal congestion.  Advised as per cold protocol - to humidify home, take Mucinex and Coricidan HBP, Tylenol ES (max 3000 mg. daily), drink plenty of fluids, especially water. Advised to keep diet light, raise head at night.  To call back if symptoms persist more than a few days or worsen.  Verbalized understanding and agreement.  Follow-up by: Dutch Quint RN,  August 01, 2010 5:34 PM

## 2011-04-16 LAB — URINALYSIS, ROUTINE W REFLEX MICROSCOPIC
Ketones, ur: NEGATIVE
Nitrite: POSITIVE — AB
Specific Gravity, Urine: 1.024
Urobilinogen, UA: 4 — ABNORMAL HIGH
pH: 6

## 2011-04-16 LAB — URINE CULTURE: Colony Count: >=100000

## 2011-04-16 LAB — URINE MICROSCOPIC-ADD ON

## 2011-05-22 ENCOUNTER — Other Ambulatory Visit (HOSPITAL_COMMUNITY): Payer: Self-pay | Admitting: Family Medicine

## 2011-05-22 DIAGNOSIS — Z1231 Encounter for screening mammogram for malignant neoplasm of breast: Secondary | ICD-10-CM

## 2011-07-13 ENCOUNTER — Ambulatory Visit (HOSPITAL_COMMUNITY): Payer: Self-pay

## 2011-07-30 ENCOUNTER — Ambulatory Visit (HOSPITAL_COMMUNITY): Payer: Self-pay

## 2011-07-30 ENCOUNTER — Ambulatory Visit (HOSPITAL_COMMUNITY)
Admission: RE | Admit: 2011-07-30 | Discharge: 2011-07-30 | Disposition: A | Discharge status: Home / Self Care | Payer: Self-pay | Source: Ambulatory Visit | Attending: Family Medicine | Admitting: Family Medicine

## 2011-07-30 DIAGNOSIS — Z1231 Encounter for screening mammogram for malignant neoplasm of breast: Secondary | ICD-10-CM | POA: Insufficient documentation

## 2012-06-30 ENCOUNTER — Other Ambulatory Visit (HOSPITAL_COMMUNITY): Payer: Self-pay | Admitting: Family Medicine

## 2012-06-30 DIAGNOSIS — Z1231 Encounter for screening mammogram for malignant neoplasm of breast: Secondary | ICD-10-CM

## 2012-07-30 ENCOUNTER — Ambulatory Visit (HOSPITAL_COMMUNITY): Payer: Self-pay | Attending: Family Medicine

## 2012-09-16 ENCOUNTER — Ambulatory Visit (HOSPITAL_COMMUNITY): Payer: Self-pay

## 2012-09-25 ENCOUNTER — Ambulatory Visit (HOSPITAL_COMMUNITY)
Admission: RE | Admit: 2012-09-25 | Discharge: 2012-09-25 | Disposition: A | Discharge status: Home / Self Care | Payer: BC Managed Care – PPO | Source: Ambulatory Visit | Attending: Family Medicine | Admitting: Family Medicine

## 2012-09-25 DIAGNOSIS — Z1231 Encounter for screening mammogram for malignant neoplasm of breast: Secondary | ICD-10-CM

## 2012-12-18 ENCOUNTER — Encounter (HOSPITAL_COMMUNITY): Payer: Self-pay

## 2012-12-18 ENCOUNTER — Emergency Department (HOSPITAL_COMMUNITY)
Admission: EM | Admit: 2012-12-18 | Discharge: 2012-12-18 | Disposition: A | Discharge status: Home / Self Care | Payer: BC Managed Care – PPO | Attending: Emergency Medicine | Admitting: Emergency Medicine

## 2012-12-18 ENCOUNTER — Emergency Department (HOSPITAL_COMMUNITY): Payer: BC Managed Care – PPO

## 2012-12-18 DIAGNOSIS — W010XXA Fall on same level from slipping, tripping and stumbling without subsequent striking against object, initial encounter: Secondary | ICD-10-CM | POA: Insufficient documentation

## 2012-12-18 DIAGNOSIS — Z79899 Other long term (current) drug therapy: Secondary | ICD-10-CM | POA: Insufficient documentation

## 2012-12-18 DIAGNOSIS — S4992XA Unspecified injury of left shoulder and upper arm, initial encounter: Secondary | ICD-10-CM

## 2012-12-18 DIAGNOSIS — S46909A Unspecified injury of unspecified muscle, fascia and tendon at shoulder and upper arm level, unspecified arm, initial encounter: Secondary | ICD-10-CM | POA: Insufficient documentation

## 2012-12-18 DIAGNOSIS — W19XXXA Unspecified fall, initial encounter: Secondary | ICD-10-CM

## 2012-12-18 DIAGNOSIS — S59909A Unspecified injury of unspecified elbow, initial encounter: Secondary | ICD-10-CM | POA: Insufficient documentation

## 2012-12-18 DIAGNOSIS — S8990XA Unspecified injury of unspecified lower leg, initial encounter: Secondary | ICD-10-CM | POA: Insufficient documentation

## 2012-12-18 DIAGNOSIS — S6990XA Unspecified injury of unspecified wrist, hand and finger(s), initial encounter: Secondary | ICD-10-CM | POA: Insufficient documentation

## 2012-12-18 DIAGNOSIS — Y929 Unspecified place or not applicable: Secondary | ICD-10-CM | POA: Insufficient documentation

## 2012-12-18 DIAGNOSIS — M25531 Pain in right wrist: Secondary | ICD-10-CM

## 2012-12-18 DIAGNOSIS — Y939 Activity, unspecified: Secondary | ICD-10-CM | POA: Insufficient documentation

## 2012-12-18 DIAGNOSIS — S8991XA Unspecified injury of right lower leg, initial encounter: Secondary | ICD-10-CM

## 2012-12-18 DIAGNOSIS — S4980XA Other specified injuries of shoulder and upper arm, unspecified arm, initial encounter: Secondary | ICD-10-CM | POA: Insufficient documentation

## 2012-12-18 MED ORDER — ACETAMINOPHEN 325 MG PO TABS
650.0000 mg | ORAL_TABLET | Freq: Once | ORAL | Status: AC
  Administered 2012-12-18: 650 mg via ORAL
  Filled 2012-12-18: qty 2

## 2012-12-18 MED ORDER — ACETAMINOPHEN 500 MG PO TABS: 500.0000 mg | ORAL_TABLET | Freq: Four times a day (QID) | ORAL | Status: DC | PRN

## 2012-12-18 MED ORDER — CYCLOBENZAPRINE HCL 10 MG PO TABS: 10.0000 mg | ORAL_TABLET | Freq: Two times a day (BID) | ORAL | Status: DC | PRN

## 2012-12-18 NOTE — ED Notes (Signed)
Pt stated that she slipped on water and fell onto floor at grocery store at 0515 today.Pt drove self. Ambulated to treatment  room

## 2012-12-18 NOTE — ED Notes (Signed)
Ace wrap applied to right knee

## 2012-12-18 NOTE — Progress Notes (Signed)
   CARE MANAGEMENT ED NOTE 12/18/2012  Patient:  Katie Andrews, Katie Andrews   Account Number:  [id number]  Date Initiated:  12/18/2012  Documentation initiated by:  Radford Pax  Subjective/Objective Assessment:   Patient presented to ED with bilateral wrist and arm pain s/p fall     Subjective/Objective Assessment Detail:     Action/Plan:   Action/Plan Detail:   Anticipated DC Date:       Status Recommendation to Physician:   Result of Recommendation:    Other ED Services  Consult Working Plan    DC Planning Services  Other  PCP issues    Choice offered to / List presented to:            Status of service:  Completed, signed off  ED Comments:   ED Comments Detail:  Patient listed as having Dr.Nykedtra Daphine Deutscher as pcp.  Dr. Daphine Deutscher was a [facility] doctor.  Patient states that she goes to the new clinic on [address] but has not established a pcp there yet.  Instructed patient to call the phone number on the back of her insurance card to find a pcp who is within network.  Patient verbalized understanding.  No futhrer needs at this time.

## 2012-12-18 NOTE — ED Provider Notes (Signed)
History     CSN: [id number]  Arrival date & time 12/18/12  1228   First MD Initiated Contact with Patient 12/18/12 1238      Chief Complaint  Patient presents with  . Fall    fell  at 5:15 am  . Wrist Pain    bilateral wrist pain  . Knee Pain    r/knee pain  . Shoulder Pain    l/shoulder pain    (Consider location/radiation/quality/duration/timing/severity/associated sxs/prior treatment) HPI Comments: Patient is a 62 year old female who presents after a fall that occurred earlier this morning in [employer]. Patient reports slipping on water and falling backwards. Since the fall, patient reports bilateral wrist, right knee, and left shoulder pain. The pain is aching and severe without radiation. Patient denies head trauma or LOC. Movement of the affected joints makes the pain worse. Nothing makes the pain better. Patient did not try anything for pain relief. Patient denies any other injury.    History reviewed. No pertinent past medical history.  History reviewed. No pertinent past surgical history.  No family history on file.  History  Substance Use Topics  . Smoking status: Never Smoker   . Smokeless tobacco: Never Used  . Alcohol Use: No    OB History   Grav Para Term Preterm Abortions TAB SAB Ect Mult Living                  Review of Systems  Musculoskeletal: Positive for arthralgias.  All other systems reviewed and are negative.    Allergies  Review of patient's allergies indicates no known allergies.  Home Medications   Current Outpatient Rx  Name  Route  Sig  Dispense  Refill  . lisinopril-hydrochlorothiazide (PRINZIDE,ZESTORETIC) 20-12.5 MG per tablet   Oral   Take 1 tablet by mouth daily.         . Multiple Vitamin (MULTIVITAMIN WITH MINERALS) TABS   Oral   Take 1 tablet by mouth daily.           BP 174/72  Pulse 88  Temp(Src) 98 F (36.7 C) (Oral)  Resp 20  SpO2 100%  Physical Exam  Nursing note and vitals  reviewed. Constitutional: She appears well-developed and well-nourished. No distress.  HENT:  Head: Normocephalic and atraumatic.  Eyes: Conjunctivae and EOM are normal.  Neck: Normal range of motion.  Cardiovascular: Normal rate and regular rhythm.  Exam reveals no gallop and no friction rub.   No murmur heard. Pulmonary/Chest: Effort normal and breath sounds normal. She has no wheezes. She has no rales. She exhibits no tenderness.  Abdominal: Soft. There is no tenderness.  Musculoskeletal: Normal range of motion.  No wrist tenderness to palpation-full ROM. Left shoulder ROM limited due to pain-no tenderness to palpation. Right knee has a small bruise, mild tenderness to palpation. Full ROM of right knee.   Neurological: She is alert. Coordination normal.  Extremity strength and sensation equal and intact bilaterally. Speech is goal-oriented. Moves limbs without ataxia.   Skin: Skin is warm and dry.  Psychiatric: She has a normal mood and affect. Her behavior is normal.    ED Course  Procedures (including critical care time)  Labs Reviewed - No data to display Dg Wrist Complete Left  12/18/2012   *RADIOLOGY REPORT*  Clinical Data: Fall  LEFT WRIST - COMPLETE 3+ VIEW  Comparison: None  Findings: There is no evidence of fracture or dislocation.  There is no evidence of arthropathy or other focal bone  abnormality. Soft tissues are unremarkable.  IMPRESSION: Negative exam.   Original Report Authenticated By: Signa Kell, M.D.   Dg Wrist Complete Right  12/18/2012   *RADIOLOGY REPORT*  Clinical Data: Chest pain  RIGHT WRIST - COMPLETE 3+ VIEW  Comparison:  None.  Findings:  There is no evidence of fracture or dislocation.  There is no evidence of arthropathy or other focal bone abnormality. Soft tissues are unremarkable.  IMPRESSION: Negative.   Original Report Authenticated By: Janeece Riggers, M.D.   Dg Shoulder Left  12/18/2012   *RADIOLOGY REPORT*  Clinical Data: Fall  LEFT SHOULDER - 2+  VIEW  Comparison: None.  Findings: Normal alignment and no fracture.  No significant degenerative change.  IMPRESSION: Negative   Original Report Authenticated By: Janeece Riggers, M.D.   Dg Knee Complete 4 Views Right  12/18/2012   *RADIOLOGY REPORT*  Clinical Data: Knee pain after fall next field  RIGHT KNEE - COMPLETE 4+ VIEW  Comparison: None  Findings: No joint effusion.  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  Changes include medial compartment narrowing, marginal spur formation and sharpening tibial spines. Ventral soft tissue swelling noted.  IMPRESSION:  1. Soft tissue swelling  2.  Osteoarthritis.   Original Report Authenticated By: Signa Kell, M.D.     1. Fall, initial encounter   2. Bilateral wrist pain   3. Right knee injury, initial encounter   4. Shoulder injury, left, initial encounter       MDM  2:22 PM Xray unremarkable. Patient will have tylenol and flexeril for pain. No neurovascular compromise. Vitals stable and patient afebrile.        Emilia Beck, PA-C 12/20/12 1038

## 2012-12-18 NOTE — ED Notes (Addendum)
Pt fell this am at 5am and has bil wrist and arm pain from the fall. States that it just began hurting her now and that she was told to come to the er if she begins to have this. No deformity noted and able to move all extermitites. Pt also c/o rt knee pain and was able to walk to room. When asked if pt took pain medication she states that she did not take anything pta.

## 2012-12-18 NOTE — ED Notes (Signed)
Patient transported to X-ray 

## 2012-12-21 NOTE — ED Provider Notes (Signed)
Medical screening examination/treatment/procedure(s) were conducted as a shared visit with non-physician practitioner(s) and myself.  I personally evaluated the patient during the encounter.  Derwood Kaplan, MD 12/21/12 [id number]

## 2013-08-26 ENCOUNTER — Other Ambulatory Visit (HOSPITAL_COMMUNITY): Payer: Self-pay | Admitting: Family Medicine

## 2013-08-26 DIAGNOSIS — Z1231 Encounter for screening mammogram for malignant neoplasm of breast: Secondary | ICD-10-CM

## 2013-09-28 ENCOUNTER — Ambulatory Visit (HOSPITAL_COMMUNITY)
Admission: RE | Admit: 2013-09-28 | Discharge: 2013-09-28 | Disposition: A | Discharge status: Home / Self Care | Payer: BC Managed Care – PPO | Source: Ambulatory Visit | Attending: Family Medicine | Admitting: Family Medicine

## 2013-09-28 DIAGNOSIS — Z1231 Encounter for screening mammogram for malignant neoplasm of breast: Secondary | ICD-10-CM | POA: Insufficient documentation

## 2013-12-08 ENCOUNTER — Encounter (HOSPITAL_COMMUNITY): Payer: Self-pay | Admitting: Emergency Medicine

## 2013-12-08 ENCOUNTER — Emergency Department (HOSPITAL_COMMUNITY)
Admission: EM | Admit: 2013-12-08 | Discharge: 2013-12-08 | Disposition: A | Discharge status: Home / Self Care | Payer: BC Managed Care – PPO | Attending: Emergency Medicine | Admitting: Emergency Medicine

## 2013-12-08 DIAGNOSIS — L03319 Cellulitis of trunk, unspecified: Principal | ICD-10-CM

## 2013-12-08 DIAGNOSIS — Z79899 Other long term (current) drug therapy: Secondary | ICD-10-CM | POA: Insufficient documentation

## 2013-12-08 DIAGNOSIS — Z859 Personal history of malignant neoplasm, unspecified: Secondary | ICD-10-CM | POA: Insufficient documentation

## 2013-12-08 DIAGNOSIS — L02818 Cutaneous abscess of other sites: Secondary | ICD-10-CM | POA: Insufficient documentation

## 2013-12-08 DIAGNOSIS — R6883 Chills (without fever): Secondary | ICD-10-CM | POA: Insufficient documentation

## 2013-12-08 DIAGNOSIS — Z872 Personal history of diseases of the skin and subcutaneous tissue: Secondary | ICD-10-CM | POA: Insufficient documentation

## 2013-12-08 DIAGNOSIS — L02219 Cutaneous abscess of trunk, unspecified: Secondary | ICD-10-CM | POA: Insufficient documentation

## 2013-12-08 DIAGNOSIS — Z7982 Long term (current) use of aspirin: Secondary | ICD-10-CM | POA: Insufficient documentation

## 2013-12-08 DIAGNOSIS — L03311 Cellulitis of abdominal wall: Secondary | ICD-10-CM

## 2013-12-08 DIAGNOSIS — L0231 Cutaneous abscess of buttock: Secondary | ICD-10-CM

## 2013-12-08 DIAGNOSIS — L03317 Cellulitis of buttock: Secondary | ICD-10-CM

## 2013-12-08 DIAGNOSIS — L02211 Cutaneous abscess of abdominal wall: Secondary | ICD-10-CM

## 2013-12-08 DIAGNOSIS — L02811 Cutaneous abscess of head [any part, except face]: Secondary | ICD-10-CM

## 2013-12-08 DIAGNOSIS — L03818 Cellulitis of other sites: Secondary | ICD-10-CM

## 2013-12-08 HISTORY — DX: Anemia, unspecified: D64.9

## 2013-12-08 HISTORY — DX: Malignant (primary) neoplasm, unspecified: C80.1

## 2013-12-08 MED ORDER — SULFAMETHOXAZOLE-TRIMETHOPRIM 800-160 MG PO TABS: 2.0000 | ORAL_TABLET | Freq: Two times a day (BID) | ORAL | Status: DC

## 2013-12-08 MED ORDER — SULFAMETHOXAZOLE-TMP DS 800-160 MG PO TABS
2.0000 | ORAL_TABLET | Freq: Once | ORAL | Status: AC
  Administered 2013-12-08: 2 via ORAL
  Filled 2013-12-08: qty 2

## 2013-12-08 MED ORDER — HYDROCODONE-ACETAMINOPHEN 5-325 MG PO TABS: ORAL_TABLET | ORAL | Status: DC

## 2013-12-08 NOTE — ED Notes (Signed)
Wounds dressed. Pt alert x4 respirations easy non labored. wc to lobby

## 2013-12-08 NOTE — ED Provider Notes (Signed)
CSN: [id number]     Arrival date & time 12/08/13  1131 History   First MD Initiated Contact with Patient 12/08/13 1152     Chief Complaint  Patient presents with  . Abscess     (Consider location/radiation/quality/duration/timing/severity/associated sxs/prior Treatment) HPI Comments: Patient presents with complaint of abscesses which began 3 days ago and have become worse since that time. Areas are red, inflamed and warm. Patient complains of one abscess on her right buttock, one in her lower abdomen, and one on her right parietal scalp. No treatments prior to arrival. No drainage. Patient has never had boils in the past. She does not have a history of diabetes or immunocompromise. The onset of this condition was acute. Aggravating factors: palpation. Alleviating factors: none.    Patient is a 63 y.o. female presenting with abscess. The history is provided by the patient.  Abscess Associated symptoms: no fever, no nausea and no vomiting     Past Medical History  Diagnosis Date  . Cancer   . Anemia    History reviewed. No pertinent past surgical history. No family history on file. History  Substance Use Topics  . Smoking status: Never Smoker   . Smokeless tobacco: Never Used  . Alcohol Use: No   OB History   Grav Para Term Preterm Abortions TAB SAB Ect Mult Living                 Review of Systems  Constitutional: Positive for chills. Negative for fever.  Gastrointestinal: Negative for nausea and vomiting.  Skin: Positive for color change.       Positive for abscess.  Hematological: Negative for adenopathy.   Allergies  Review of patient's allergies indicates no known allergies.  Home Medications   Prior to Admission medications   Medication Sig Start Date End Date Taking? Authorizing Provider  aspirin EC 81 MG tablet Take 81 mg by mouth daily.   Yes Historical Provider, MD  lisinopril-hydrochlorothiazide (PRINZIDE,ZESTORETIC) 20-12.5 MG per tablet Take 1 tablet by  mouth daily.   Yes Historical Provider, MD   BP 144/46  Pulse 79  Resp 18  Ht 5\' 3"  (1.6 m)  Wt 251 lb (113.853 kg)  BMI 44.47 kg/m2  SpO2 100%  Physical Exam  Nursing note and vitals reviewed. Constitutional: She appears well-developed and well-nourished.  HENT:  Head: Normocephalic and atraumatic.  Eyes: Conjunctivae are normal.  Neck: Normal range of motion. Neck supple.  Pulmonary/Chest: No respiratory distress.  Neurological: She is alert.  Skin: Skin is warm and dry.  Patient with 3 cm induration in lower abdomen inferior to umbilicus with moderate surrounding cellulitis. Patient with inferior right buttocks abscess, poorly formed, 1-2cm induration with extensive surrounding cellulitis. Patient with small (>1cm) area of induration right parietal scalp. No drainage in any area.  Psychiatric: She has a normal mood and affect.    ED Course  Procedures (including critical care time) Labs Review Labs Reviewed - No data to display  Imaging Review No results found.   EKG Interpretation None      12:13 PM Patient seen and examined. Medications ordered. Will attempt I&D. Antibiotics ordered 2/2 cellulitis.   Vital signs reviewed and are as follows: Filed Vitals:   12/08/13 1159  BP: 144/46  Pulse: 79  Resp: 18   INCISION AND DRAINAGE Performed by: Carlisle Cater Consent: Verbal consent obtained. Risks and benefits: risks, benefits and alternatives were discussed Type: abscess  Body area: inferior abdomen  Anesthesia: local infiltration  Incision  was made with a scalpel.  Local anesthetic: lidocaine 2% with epinephrine  Anesthetic total: 20ml  Complexity: complex Blunt dissection to break up loculations  Drainage: purulent  Drainage amount: mild  Packing material: none  Patient tolerance: Patient tolerated the procedure well with no immediate complications.    INCISION AND DRAINAGE Performed by: Carlisle Cater Consent: Verbal consent  obtained. Risks and benefits: risks, benefits and alternatives were discussed Type: abscess  Body area: R buttocks  Anesthesia: local infiltration  Incision was made with a scalpel.  Local anesthetic: lidocaine 2% with epinephrine  Anesthetic total: 3 ml  Complexity: complex Blunt dissection to break up loculations  Drainage: purulent  Drainage amount: minimal  Packing material: none  Patient tolerance: Patient tolerated the procedure well with no immediate complications.   1:07 PM The patient was urged to return to the Emergency Department urgently with worsening pain, swelling, expanding erythema especially if it streaks away from the affected area, fever, or if they have any other concerns.   The patient was urged to return to the Emergency Department or go to their PCP in 48 hours for wound recheck.  Counseled to use warm soaks and compresses tid.   The patient verbalized understanding and stated agreement with this plan.   Patient counseled on use of narcotic pain medications. Counseled not to combine these medications with others containing tylenol. Urged not to drink alcohol, drive, or perform any other activities that requires focus while taking these medications. The patient verbalizes understanding and agrees with the plan.   MDM   Final diagnoses:  Abscess of abdominal wall  Cellulitis and abscess of buttock  Cellulitis of abdominal wall  Abscess of scalp   Patient with multiple abscesses, 2 of which were drained in the emergency department today. Patient has significant cellulitis surrounding each of these abscesses. She was started on Bactrim by mouth. Patient urged to return or see PCP in 2 days for recheck and return if worsening. No fevers or systemic symptoms of illness. Do not feel that further evaluation with labs or hospitalization is indicated at this point.    Carlisle Cater, PA-C 12/08/13 1308

## 2013-12-08 NOTE — ED Notes (Signed)
Pt. Developed 3 abscesses  1 on her lower abdomen under her navel.  1 abscess rt. Buttock cheek and 1 above the rt. Ear.  They are red, swollen and warm to touch.  No drainage noted.  Very painful

## 2013-12-08 NOTE — ED Notes (Signed)
Md at bedside for I&D

## 2013-12-08 NOTE — ED Notes (Signed)
Pt hooked up to monitor pt being monitored by pulse ox and blood pressure.

## 2013-12-08 NOTE — Discharge Instructions (Signed)
Please read and follow all provided instructions.  Your diagnoses today include:  1. Abscess of abdominal wall   2. Cellulitis and abscess of buttock   3. Cellulitis of abdominal wall   4. Abscess of scalp     Tests performed today include:  Vital signs. See below for your results today.   Medications prescribed:   Bactrim (trimethoprim/sulfamethoxazole) - antibiotic  You have been prescribed an antibiotic medicine: take the entire course of medicine even if you are feeling better. Stopping early can cause the antibiotic not to work.   Vicodin (hydrocodone/acetaminophen) - narcotic pain medication  DO NOT drive or perform any activities that require you to be awake and alert because this medicine can make you drowsy. BE VERY CAREFUL not to take multiple medicines containing Tylenol (also called acetaminophen). Doing so can lead to an overdose which can damage your liver and cause liver failure and possibly death.  Take any prescribed medications only as directed.   Home care instructions:   Follow any educational materials contained in this packet  Follow-up instructions: Return to the Emergency Department or see your primary care doctor in 48 hours for recheck.  Please follow-up with your primary care provider in the next 1 week for further evaluation of your symptoms. If you do not have a primary care doctor -- see below for referral information.   Return instructions:  Return to the Emergency Department if you have:  Fever  Worsening symptoms  Worsening pain  Worsening swelling  Redness of the skin that moves away from the affected area, especially if it streaks away from the affected area   Any other emergent concerns  Your vital signs today were: BP 144/46   Pulse 79   Resp 18   Ht 5\' 3"  (1.6 m)   Wt 251 lb (113.853 kg)   BMI 44.47 kg/m2   SpO2 100% If your blood pressure (BP) was elevated above 135/85 this visit, please have this repeated by your doctor  within one month. --------------

## 2013-12-09 NOTE — ED Provider Notes (Signed)
Medical screening examination/treatment/procedure(s) were performed by non-physician practitioner and as supervising physician I was immediately available for consultation/collaboration.   EKG Interpretation None        Maudry Diego, MD 12/09/13 2050

## 2013-12-14 ENCOUNTER — Encounter (HOSPITAL_COMMUNITY): Payer: Self-pay | Admitting: Emergency Medicine

## 2013-12-14 ENCOUNTER — Emergency Department (HOSPITAL_COMMUNITY)
Admission: EM | Admit: 2013-12-14 | Discharge: 2013-12-14 | Disposition: A | Discharge status: Home / Self Care | Payer: BC Managed Care – PPO | Attending: Emergency Medicine | Admitting: Emergency Medicine

## 2013-12-14 DIAGNOSIS — Z862 Personal history of diseases of the blood and blood-forming organs and certain disorders involving the immune mechanism: Secondary | ICD-10-CM | POA: Insufficient documentation

## 2013-12-14 DIAGNOSIS — Z79899 Other long term (current) drug therapy: Secondary | ICD-10-CM | POA: Insufficient documentation

## 2013-12-14 DIAGNOSIS — Z7982 Long term (current) use of aspirin: Secondary | ICD-10-CM | POA: Insufficient documentation

## 2013-12-14 DIAGNOSIS — Z4800 Encounter for change or removal of nonsurgical wound dressing: Secondary | ICD-10-CM | POA: Insufficient documentation

## 2013-12-14 DIAGNOSIS — Z859 Personal history of malignant neoplasm, unspecified: Secondary | ICD-10-CM | POA: Insufficient documentation

## 2013-12-14 DIAGNOSIS — Z5189 Encounter for other specified aftercare: Secondary | ICD-10-CM

## 2013-12-14 NOTE — ED Provider Notes (Signed)
  Medical screening examination/treatment/procedure(s) were performed by non-physician practitioner and as supervising physician I was immediately available for consultation/collaboration.   EKG Interpretation None         Carmin Muskrat, MD 12/14/13 1238

## 2013-12-14 NOTE — ED Notes (Signed)
Here for wound check-draining wound to lower abdomen and right posterior thigh.

## 2013-12-14 NOTE — ED Provider Notes (Signed)
CSN: [id number]     Arrival date & time 12/14/13  7829 History  This chart was scribed for non-physician practitioner Glendell Docker, NP working with Carmin Muskrat, MD by Eston Mould, ED Scribe. This patient was seen in room TR11C/TR11C and the patient's care was started at 9:13 AM .  No chief complaint on file.  The history is provided by the patient. No language interpreter was used.   HPI Comments: Katie Andrews is a 63 y.o. female who presents to the Emergency Department complaining of wound check and continuous drainage that began 1 week ago. Pt states she was placed on oral antibiotics,Bactrim, during her last visit, 12/08/2013, and was advised to come to the ED for further evaluation if sx worsen. States the pain medication, Hydrocodone, is causing her to become "jumpy". UTD on Tetanus. Denies DM, fever, and chills.   Past Medical History  Diagnosis Date  . Cancer   . Anemia    No past surgical history on file. No family history on file. History  Substance Use Topics  . Smoking status: Never Smoker   . Smokeless tobacco: Never Used  . Alcohol Use: No   OB History   Grav Para Term Preterm Abortions TAB SAB Ect Mult Living                 Review of Systems  Constitutional: Negative for fever and chills.  Skin: Positive for wound. Negative for color change.  All other systems reviewed and are negative.  Allergies  Review of patient's allergies indicates no known allergies.  Home Medications   Prior to Admission medications   Medication Sig Start Date End Date Taking? Authorizing Provider  aspirin EC 81 MG tablet Take 81 mg by mouth daily.    Historical Provider, MD  HYDROcodone-acetaminophen (NORCO/VICODIN) 5-325 MG per tablet Take 1-2 tablets every 6 hours as needed for severe pain 12/08/13   Carlisle Cater, PA-C  lisinopril-hydrochlorothiazide (PRINZIDE,ZESTORETIC) 20-12.5 MG per tablet Take 1 tablet by mouth daily.    Historical Provider, MD   sulfamethoxazole-trimethoprim (BACTRIM DS,SEPTRA DS) 800-160 MG per tablet Take 2 tablets by mouth 2 (two) times daily. 12/08/13   Carlisle Cater, PA-C   BP 130/40  Pulse 83  Temp(Src) 98.6 F (37 C) (Oral)  SpO2 99%  Physical Exam  Nursing note and vitals reviewed. Constitutional: She is oriented to person, place, and time. She appears well-developed and well-nourished. No distress.  HENT:  Head: Normocephalic and atraumatic.  Eyes: EOM are normal.  Neck: Neck supple. No tracheal deviation present.  Cardiovascular: Normal rate.   Pulmonary/Chest: Effort normal. No respiratory distress.  Musculoskeletal: Normal range of motion.  Neurological: She is alert and oriented to person, place, and time.  Skin: Skin is warm and dry.  Lower L abdomen and R lower buttocks small area with drainage. No redness, fluctuance, and firmness noted.  Psychiatric: She has a normal mood and affect. Her behavior is normal.   ED Course  Procedures  DIAGNOSTIC STUDIES: Oxygen Saturation is 99% on RA, normal by my interpretation.    COORDINATION OF CARE: 9:17 AM-Discussed treatment plan which includes advised pt to continue taking antibiotics as prescribed and informed pt wounds are healing well. Pt agreed to plan.   Labs Review Labs Reviewed - No data to display  Imaging Review No results found.   EKG Interpretation None      MDM   Final diagnoses:  Wound check, abscess    Wound healing without any problem:pt to continue  antibiotics  I personally performed the services described in this documentation, which was scribed in my presence. The recorded information has been reviewed and is accurate.    Glendell Docker, NP 12/14/13 [phone]

## 2013-12-14 NOTE — Discharge Instructions (Signed)
Continue to take your antibiotics. Follow up for continued or worsening symptoms Wound Care Wound care helps prevent pain and infection.  You may need a tetanus shot if:  You cannot remember when you had your last tetanus shot.  You have never had a tetanus shot.  The injury broke your skin. If you need a tetanus shot and you choose not to have one, you may get tetanus. Sickness from tetanus can be serious. HOME CARE   Only take medicine as told by your doctor.  Clean the wound daily with mild soap and water.  Change any bandages (dressings) as told by your doctor.  Put medicated cream and a bandage on the wound as told by your doctor.  Change the bandage if it gets wet, dirty, or starts to smell.  Take showers. Do not take baths, swim, or do anything that puts your wound under water.  Rest and raise (elevate) the wound until the pain and puffiness (swelling) are better.  Keep all doctor visits as told. GET HELP RIGHT AWAY IF:   Yellowish-white fluid (pus) comes from the wound.  Medicine does not lessen your pain.  There is a red streak going away from the wound.  You have a fever. MAKE SURE YOU:   Understand these instructions.  Will watch your condition.  Will get help right away if you are not doing well or get worse. Document Released: 03/27/2008 Document Revised: 09/10/2011 Document Reviewed: 10/22/2010 [facility] Patient Information 2014 [place], [place].

## 2014-03-03 ENCOUNTER — Emergency Department (HOSPITAL_COMMUNITY)
Admission: EM | Admit: 2014-03-03 | Discharge: 2014-03-03 | Disposition: A | Discharge status: Home / Self Care | Payer: Self-pay | Attending: Emergency Medicine | Admitting: Emergency Medicine

## 2014-03-03 ENCOUNTER — Encounter (HOSPITAL_COMMUNITY): Payer: Self-pay | Admitting: Emergency Medicine

## 2014-03-03 DIAGNOSIS — L03317 Cellulitis of buttock: Principal | ICD-10-CM

## 2014-03-03 DIAGNOSIS — L0231 Cutaneous abscess of buttock: Secondary | ICD-10-CM | POA: Insufficient documentation

## 2014-03-03 DIAGNOSIS — Z7982 Long term (current) use of aspirin: Secondary | ICD-10-CM | POA: Insufficient documentation

## 2014-03-03 DIAGNOSIS — Z859 Personal history of malignant neoplasm, unspecified: Secondary | ICD-10-CM | POA: Insufficient documentation

## 2014-03-03 DIAGNOSIS — Z79899 Other long term (current) drug therapy: Secondary | ICD-10-CM | POA: Insufficient documentation

## 2014-03-03 DIAGNOSIS — Z862 Personal history of diseases of the blood and blood-forming organs and certain disorders involving the immune mechanism: Secondary | ICD-10-CM | POA: Insufficient documentation

## 2014-03-03 MED ORDER — TRAMADOL HCL 50 MG PO TABS: 50.0000 mg | ORAL_TABLET | Freq: Four times a day (QID) | ORAL | Status: DC | PRN

## 2014-03-03 MED ORDER — SULFAMETHOXAZOLE-TRIMETHOPRIM 800-160 MG PO TABS: 1.0000 | ORAL_TABLET | Freq: Two times a day (BID) | ORAL | Status: DC

## 2014-03-03 MED ORDER — CEPHALEXIN 500 MG PO CAPS: 500.0000 mg | ORAL_CAPSULE | Freq: Four times a day (QID) | ORAL | Status: DC

## 2014-03-03 NOTE — ED Notes (Signed)
Abscess/boil in the inner groin - rt. Side. Reddened, tender, warm to touch.

## 2014-03-03 NOTE — ED Provider Notes (Signed)
CSN: [id number]     Arrival date & time 03/03/14  0242 History   First MD Initiated Contact with Patient 03/03/14 [phone]     Chief Complaint  Patient presents with  . Abscess     (Consider location/radiation/quality/duration/timing/severity/associated sxs/prior Treatment) HPI Comments: Patient is a 63 year old female who presents with a 4 day history of abscess to her right buttock. Symptoms started gradually and progressively worsened since the onset. Patient reports throbbing and moderate pain without radiation. Palpation makes the pain worse. No alleviating factors. Patient reports having "boils" previously. Patient tried warm compresses for symptom relief. No other associated symptoms.    Past Medical History  Diagnosis Date  . Cancer   . Anemia    History reviewed. No pertinent past surgical history. History reviewed. No pertinent family history. History  Substance Use Topics  . Smoking status: Never Smoker   . Smokeless tobacco: Never Used  . Alcohol Use: No   OB History   Grav Para Term Preterm Abortions TAB SAB Ect Mult Living                 Review of Systems  Constitutional: Negative for fever, chills and fatigue.  HENT: Negative for trouble swallowing.   Eyes: Negative for visual disturbance.  Respiratory: Negative for shortness of breath.   Cardiovascular: Negative for chest pain and palpitations.  Gastrointestinal: Negative for nausea, vomiting, abdominal pain and diarrhea.  Genitourinary: Negative for dysuria and difficulty urinating.  Musculoskeletal: Negative for arthralgias and neck pain.  Skin: Positive for wound. Negative for color change.  Neurological: Negative for dizziness and weakness.  Psychiatric/Behavioral: Negative for dysphoric mood.      Allergies  Review of patient's allergies indicates no known allergies.  Home Medications   Prior to Admission medications   Medication Sig Start Date End Date Taking? Authorizing Provider  aspirin EC 81  MG tablet Take 81 mg by mouth daily.   Yes Historical Provider, MD  lisinopril-hydrochlorothiazide (PRINZIDE,ZESTORETIC) 20-12.5 MG per tablet Take 1 tablet by mouth daily.   Yes Historical Provider, MD   BP 110/62  Pulse 86  Temp(Src) 98.8 F (37.1 C) (Oral)  Resp 20  SpO2 99% Physical Exam  Nursing note and vitals reviewed. Constitutional: She is oriented to person, place, and time. She appears well-developed and well-nourished. No distress.  HENT:  Head: Normocephalic and atraumatic.  Eyes: Conjunctivae and EOM are normal.  Neck: Normal range of motion.  Cardiovascular: Normal rate and regular rhythm.  Exam reveals no gallop and no friction rub.   No murmur heard. Pulmonary/Chest: Effort normal and breath sounds normal. She has no wheezes. She has no rales. She exhibits no tenderness.  Abdominal: Soft. She exhibits no distension. There is no tenderness. There is no rebound.  Musculoskeletal: Normal range of motion.  Neurological: She is alert and oriented to person, place, and time. Coordination normal.  Speech is goal-oriented. Moves limbs without ataxia.   Skin: Skin is warm and dry.  12x5cm area of erythema and induration of right lower buttock with central fluctuance. No open wound or drainage.   Psychiatric: She has a normal mood and affect. Her behavior is normal.    ED Course  Procedures (including critical care time) Labs Review Labs Reviewed - No data to display  INCISION AND DRAINAGE Performed by: Alvina Chou Consent: Verbal consent obtained. Risks and benefits: risks, benefits and alternatives were discussed Type: abscess  Body area: right buttock  Anesthesia: local infiltration  Incision was made  with a scalpel.  Local anesthetic: lidocaine 1% without epinephrine  Anesthetic total: 2 ml  Complexity: complex Blunt dissection to break up loculations  Drainage: purulent  Drainage amount: copious  Packing material: none  Patient tolerance:  Patient tolerated the procedure well with no immediate complications.     Imaging Review No results found.   EKG Interpretation None      MDM   Final diagnoses:  Cellulitis and abscess of buttock    3:48 AM Patient's abscess drained without complication. Patient will have bactrim, keflex, and tramadol for symptoms. Vitals stable and patient afebrile. Patient instructed to return with worsening or concerning symptoms.     Alvina Chou, [place] 03/03/14 [phone]

## 2014-03-03 NOTE — ED Provider Notes (Signed)
Medical screening examination/treatment/procedure(s) were performed by non-physician practitioner and as supervising physician I was immediately available for consultation/collaboration.   EKG Interpretation None       Kalman Drape, MD 03/03/14 [phone]

## 2014-03-03 NOTE — Discharge Instructions (Signed)
Take Keflex and Bactrim as directed until gone. Take Tramadol as needed for pain. Refer to attached documents for more information.

## 2014-05-05 ENCOUNTER — Encounter: Payer: Self-pay | Admitting: Internal Medicine

## 2014-05-05 ENCOUNTER — Ambulatory Visit (HOSPITAL_BASED_OUTPATIENT_CLINIC_OR_DEPARTMENT_OTHER): Payer: Self-pay | Admitting: *Deleted

## 2014-05-05 ENCOUNTER — Ambulatory Visit: Payer: Self-pay | Attending: Internal Medicine | Admitting: Internal Medicine

## 2014-05-05 VITALS — BP 165/78 | HR 81 | Temp 99.1°F | Resp 16 | Ht 63.0 in | Wt 241.0 lb

## 2014-05-05 DIAGNOSIS — K130 Diseases of lips: Secondary | ICD-10-CM

## 2014-05-05 DIAGNOSIS — Z7982 Long term (current) use of aspirin: Secondary | ICD-10-CM | POA: Insufficient documentation

## 2014-05-05 DIAGNOSIS — L0291 Cutaneous abscess, unspecified: Secondary | ICD-10-CM

## 2014-05-05 DIAGNOSIS — L02415 Cutaneous abscess of right lower limb: Secondary | ICD-10-CM | POA: Insufficient documentation

## 2014-05-05 DIAGNOSIS — I10 Essential (primary) hypertension: Secondary | ICD-10-CM

## 2014-05-05 DIAGNOSIS — Z859 Personal history of malignant neoplasm, unspecified: Secondary | ICD-10-CM | POA: Insufficient documentation

## 2014-05-05 DIAGNOSIS — Z23 Encounter for immunization: Secondary | ICD-10-CM

## 2014-05-05 DIAGNOSIS — Z79899 Other long term (current) drug therapy: Secondary | ICD-10-CM | POA: Insufficient documentation

## 2014-05-05 LAB — POCT GLYCOSYLATED HEMOGLOBIN (HGB A1C): Hemoglobin A1C: 4

## 2014-05-05 MED ORDER — FLUCONAZOLE 150 MG PO TABS: 150.0000 mg | ORAL_TABLET | ORAL | Status: DC

## 2014-05-05 MED ORDER — SULFAMETHOXAZOLE-TRIMETHOPRIM 800-160 MG PO TABS: 1.0000 | ORAL_TABLET | Freq: Two times a day (BID) | ORAL | Status: DC

## 2014-05-05 MED ORDER — NYSTATIN 100000 UNIT/GM EX CREA: 1.0000 "application " | TOPICAL_CREAM | Freq: Two times a day (BID) | CUTANEOUS | Status: DC

## 2014-05-05 NOTE — Progress Notes (Signed)
Pt is here to establish care. Pt reports having frequent boils and rashes on her vagina and buttocks.

## 2014-05-05 NOTE — Progress Notes (Signed)
Patient ID: Katie Andrews, female   DOB: 1951-02-05, 63 y.o.   MRN: [id number]  WUJ:[id number]  [id number]  DOB - 12-09-1950  CC:  Chief Complaint  Patient presents with  . Establish Care       HPI: Katie Andrews is a 63 y.o. female here today to establish medical care.  Patient presents to clinic today with concerns of frequent reoccurring abscesses in her vaginal and buttocks region.  She has been to the ER several times for the same complaint that has resulted in I&D of the abscess.  Her most current I&D was in September and she was given Bactrim and Keflex to complete at that time.  She reports that now she has a different abscess that is causing severe itching on her right inner thigh.  The abscess opened this morning with a bloody drainage.  She notes erythema, warmth, and tenderness around the abscess.  Patient has a past medical history of anemia and hypertension.  She has been on prinzide 20-12.5 mg but has been out of medication for a few weeks.    Patient has No headache, No chest pain, No abdominal pain - No Nausea, No new weakness tingling or numbness, No Cough - SOB.  No Known Allergies Past Medical History  Diagnosis Date  . Cancer   . Anemia    Current Outpatient Prescriptions on File Prior to Visit  Medication Sig Dispense Refill  . aspirin EC 81 MG tablet Take 81 mg by mouth daily.    Marland Kitchen lisinopril-hydrochlorothiazide (PRINZIDE,ZESTORETIC) 20-12.5 MG per tablet Take 1 tablet by mouth daily.    . traMADol (ULTRAM) 50 MG tablet Take 1 tablet (50 mg total) by mouth every 6 (six) hours as needed. 15 tablet 0   No current facility-administered medications on file prior to visit.   History reviewed. No pertinent family history. History   Social History  . Marital Status: Widowed    Spouse Name: N/A    Number of Children: N/A  . Years of Education: N/A   Occupational History  . Not on file.   Social History Main Topics  . Smoking status: Never Smoker   .  Smokeless tobacco: Never Used  . Alcohol Use: No  . Drug Use: No  . Sexual Activity: Not on file   Other Topics Concern  . Not on file   Social History Narrative    Review of Systems  Skin: Positive for itching. Rash: abscess.  All other systems reviewed and are negative.      Objective:   Filed Vitals:   05/05/14 1001  BP: 165/78  Pulse: 81  Temp: 99.1 F (37.3 C)  Resp: 16    Physical Exam  Cardiovascular: Normal rate, regular rhythm and normal heart sounds.   Pulmonary/Chest: Effort normal and breath sounds normal.  No rash noted under breast  Abdominal: Soft. Bowel sounds are normal. She exhibits no distension.  Genitourinary: There is rash on the right labia. There is rash on the left labia.  Intertrigo   Musculoskeletal: She exhibits edema and tenderness.  Skin: Skin is warm. Lesion noted.     Psychiatric: She has a normal mood and affect.     Lab Results  Component Value Date   WBC 5.2 09/16/2009   HGB 10.0* 09/16/2009   HCT 31.8* 09/16/2009   MCV 54.3* 09/16/2009   PLT 172 09/16/2009   Lab Results  Component Value Date   CREATININE 0.83 09/16/2009   BUN 21 09/16/2009   NA 137  09/16/2009   K 4.8 09/16/2009   CL 103 09/16/2009   CO2 23 09/16/2009    No results found for: HGBA1C Lipid Panel     Component Value Date/Time   CHOL 194 09/16/2009 0000   TRIG 72 09/16/2009 0000   HDL 50 09/16/2009 0000   CHOLHDL 3.9 Ratio 09/16/2009 0000   VLDL 14 09/16/2009 0000   LDLCALC 130* 09/16/2009 0000       Assessment and plan:   Katie Andrews was seen today for establish care.  Diagnoses and associated orders for this visit:  Essential hypertension - COMPLETE METABOLIC PANEL WITH GFR; Future Patient may need medication adjustments to BP meds due to continued elevated pressure,  May increase to lisinopril-HCTZ 20-25 if no improvement on nurse visit.   Abscess - sulfamethoxazole-trimethoprim (BACTRIM DS,SEPTRA DS) 800-160 MG per tablet; Take 1  tablet by mouth 2 (two) times daily. - Wound culture  Intertrigo labialis - POCT glycosylated hemoglobin (Hb A1C) - POCT glucose (manual entry) - nystatin cream (MYCOSTATIN); Apply 1 application topically 2 (two) times daily. - fluconazole (DIFLUCAN) 150 MG tablet; Take 1 tablet (150 mg total) by mouth once a week. Take one tablet once per week for 4 weeks  Morbid obesity - Lipid panel; Future - CBC; Future - TSH; Future    Return in about 1 week (around 05/12/2014) for Lab Visit and , Nurse Visit-BP check and 2 mo PCP.    The patient was given clear instructions to go to ER or return to medical center if symptoms don't improve, worsen or new problems develop. The patient verbalized understanding.   Chari Manning, [place] [facility] [phone] 05/07/2014, 10:18 AM

## 2014-05-05 NOTE — Patient Instructions (Signed)

## 2014-05-07 LAB — WOUND CULTURE: Gram Stain: NONE SEEN

## 2014-05-12 ENCOUNTER — Ambulatory Visit: Payer: Self-pay | Attending: Internal Medicine

## 2014-05-12 LAB — LIPID PANEL
CHOLESTEROL: 166 mg/dL (ref 0–200)
HDL: 44 mg/dL (ref >39)
LDL Cholesterol: 109 mg/dL — ABNORMAL HIGH (ref 0–99)
Total CHOL/HDL Ratio: 3.8 Ratio
Triglycerides: 66 mg/dL (ref <150)
VLDL: 13 mg/dL (ref 0–40)

## 2014-05-17 ENCOUNTER — Telehealth: Payer: Self-pay | Admitting: Emergency Medicine

## 2014-05-17 NOTE — Telephone Encounter (Signed)
Pt given lab results with education on diet/exercise  States she has some bruising on arm after blood draw. Instructed pt to monitor for swelling and pain

## 2014-05-17 NOTE — Telephone Encounter (Signed)
-----   Message from Lance Bosch, NP sent at 05/16/2014 10:46 PM EST ----- Patient LDL is slightly elevated. Please educate patient on diet and exercise to avoid medication use.

## 2014-07-15 ENCOUNTER — Ambulatory Visit: Payer: Self-pay | Attending: Internal Medicine | Admitting: Internal Medicine

## 2014-07-15 ENCOUNTER — Encounter: Payer: Self-pay | Admitting: Internal Medicine

## 2014-07-15 VITALS — BP 196/74 | HR 67 | Temp 98.5°F | Resp 14 | Ht 63.0 in | Wt 257.0 lb

## 2014-07-15 DIAGNOSIS — I10 Essential (primary) hypertension: Secondary | ICD-10-CM

## 2014-07-15 DIAGNOSIS — L0291 Cutaneous abscess, unspecified: Secondary | ICD-10-CM

## 2014-07-15 DIAGNOSIS — L02411 Cutaneous abscess of right axilla: Secondary | ICD-10-CM | POA: Insufficient documentation

## 2014-07-15 DIAGNOSIS — Z7982 Long term (current) use of aspirin: Secondary | ICD-10-CM | POA: Insufficient documentation

## 2014-07-15 MED ORDER — SULFAMETHOXAZOLE-TRIMETHOPRIM 800-160 MG PO TABS: 1.0000 | ORAL_TABLET | Freq: Two times a day (BID) | ORAL | Status: DC

## 2014-07-15 MED ORDER — LISINOPRIL-HYDROCHLOROTHIAZIDE 20-25 MG PO TABS: 1.0000 | ORAL_TABLET | Freq: Every day | ORAL | Status: DC

## 2014-07-15 NOTE — Progress Notes (Signed)
Patient ID: Katie Andrews, female   DOB: Aug 04, 1950, 64 y.o.   MRN: [id number]  CC: follow up  HPI: Katie Andrews is a 64 y.o. female here today for a follow up visit.  Patient has past medical history of anemia, htn, and cancer.  Today she reports that she took her blood pressure medication but has noticed more swelling in her feet here lately. She states that she ate canned meat this morning and after that she developed a bad headache.  She has a diet that is high in sodium.  She c/o of abscess formation under her right axilla that drained white fluid this morning.  The lump is not painful at this moment and she has tried warm compresses.  She denies redness, warmth, fever, or chills.   No Known Allergies Past Medical History  Diagnosis Date  . Cancer   . Anemia    Current Outpatient Prescriptions on File Prior to Visit  Medication Sig Dispense Refill  . aspirin EC 81 MG tablet Take 81 mg by mouth daily.    . fluconazole (DIFLUCAN) 150 MG tablet Take 1 tablet (150 mg total) by mouth once a week. Take one tablet once per week for 4 weeks 4 tablet 0  . lisinopril-hydrochlorothiazide (PRINZIDE,ZESTORETIC) 20-12.5 MG per tablet Take 1 tablet by mouth daily.    Marland Kitchen nystatin cream (MYCOSTATIN) Apply 1 application topically 2 (two) times daily. 30 g 1  . traMADol (ULTRAM) 50 MG tablet Take 1 tablet (50 mg total) by mouth every 6 (six) hours as needed. 15 tablet 0  . sulfamethoxazole-trimethoprim (BACTRIM DS,SEPTRA DS) 800-160 MG per tablet Take 1 tablet by mouth 2 (two) times daily. (Patient not taking: Reported on 07/15/2014) 20 tablet 0   No current facility-administered medications on file prior to visit.   History reviewed. No pertinent family history. History   Social History  . Marital Status: Widowed    Spouse Name: N/A    Number of Children: N/A  . Years of Education: N/A   Occupational History  . Not on file.   Social History Main Topics  . Smoking status: Never Smoker   .  Smokeless tobacco: Never Used  . Alcohol Use: No  . Drug Use: No  . Sexual Activity: Not on file   Other Topics Concern  . Not on file   Social History Narrative    Review of Systems  Eyes: Negative for blurred vision.  Cardiovascular: Positive for leg swelling.  Neurological: Positive for headaches.  All other systems reviewed and are negative.     Objective:   Filed Vitals:   07/15/14 0943  BP: 196/74  Pulse: 67  Temp: 98.5 F (36.9 C)  Resp: 14    Physical Exam  Constitutional: She is oriented to person, place, and time.  Cardiovascular: Normal rate, regular rhythm and normal heart sounds.   Pulmonary/Chest: Effort normal and breath sounds normal.    Skin intact. Abscess formation  Abdominal: Soft. Bowel sounds are normal.  Musculoskeletal: She exhibits edema (BLE).  Neurological: She is alert and oriented to person, place, and time.  Skin: Skin is warm and dry.  Psychiatric: She has a normal mood and affect.     Lab Results  Component Value Date   WBC 5.2 09/16/2009   HGB 10.0* 09/16/2009   HCT 31.8* 09/16/2009   MCV 54.3* 09/16/2009   PLT 172 09/16/2009   Lab Results  Component Value Date   CREATININE 0.83 09/16/2009   BUN 21 09/16/2009  NA 137 09/16/2009   K 4.8 09/16/2009   CL 103 09/16/2009   CO2 23 09/16/2009    Lab Results  Component Value Date   HGBA1C 4.0 05/05/2014   Lipid Panel     Component Value Date/Time   CHOL 166 05/12/2014 0900   TRIG 66 05/12/2014 0900   HDL 44 05/12/2014 0900   CHOLHDL 3.8 05/12/2014 0900   VLDL 13 05/12/2014 0900   LDLCALC 109* 05/12/2014 0900       Assessment and plan:   [place] was seen today for follow-up.  Diagnoses and associated orders for this visit:  Essential hypertension - lisinopril-hydrochlorothiazide (PRINZIDE,ZESTORETIC) 20-25 MG per tablet; Take 1 tablet by mouth daily. Patient blood pressure remains elevated today, will increase (HCTZ TO 25 mg) BP medication and have patient  to return in 2 weeks for blood pressure recheck with nurse. Stressed diet changes, regular exercise regimen, and modifiable risk factors. Will follow up with CMP as needed, Will follow up with patient in 3-6 months.   Abscess - sulfamethoxazole-trimethoprim (BACTRIM DS,SEPTRA DS) 800-160 MG per tablet; Take 1 tablet by mouth 2 (two) times daily. Patient does not need antibiotic at this time    Return in about 1 week (around 07/22/2014) for BP check with RN and 3 mo PCP.        Chari Manning, [place] [facility] [phone] 07/15/2014, 10:02 AM

## 2014-07-15 NOTE — Patient Instructions (Signed)
DASH Eating Plan °DASH stands for "Dietary Approaches to Stop Hypertension." The DASH eating plan is a healthy eating plan that has been shown to reduce high blood pressure (hypertension). Additional health benefits may include reducing the risk of type 2 diabetes mellitus, heart disease, and stroke. The DASH eating plan may also help with weight loss. °WHAT DO I NEED TO KNOW ABOUT THE DASH EATING PLAN? °For the DASH eating plan, you will follow these general guidelines: °· Choose foods with a percent daily value for sodium of less than 5% (as listed on the food label). °· Use salt-free seasonings or herbs instead of table salt or sea salt. °· Check with your health care provider or pharmacist before using salt substitutes. °· Eat lower-sodium products, often labeled as "lower sodium" or "no salt added." °· Eat fresh foods. °· Eat more vegetables, fruits, and low-fat dairy products. °· Choose whole grains. Look for the word "whole" as the first word in the ingredient list. °· Choose fish and skinless chicken or turkey more often than red meat. Limit fish, poultry, and meat to 6 oz (170 g) each day. °· Limit sweets, desserts, sugars, and sugary drinks. °· Choose heart-healthy fats. °· Limit cheese to 1 oz (28 g) per day. °· Eat more home-cooked food and less restaurant, buffet, and fast food. °· Limit fried foods. °· Cook foods using methods other than frying. °· Limit canned vegetables. If you do use them, rinse them well to decrease the sodium. °· When eating at a restaurant, ask that your food be prepared with less salt, or no salt if possible. °WHAT FOODS CAN I EAT? °Seek help from a dietitian for individual calorie needs. °Grains °Whole grain or whole wheat bread. Brown rice. Whole grain or whole wheat pasta. Quinoa, bulgur, and whole grain cereals. Low-sodium cereals. Corn or whole wheat flour tortillas. Whole grain cornbread. Whole grain crackers. Low-sodium crackers. °Vegetables °Fresh or frozen vegetables  (raw, steamed, roasted, or grilled). Low-sodium or reduced-sodium tomato and vegetable juices. Low-sodium or reduced-sodium tomato sauce and paste. Low-sodium or reduced-sodium canned vegetables.  °Fruits °All fresh, canned (in natural juice), or frozen fruits. °Meat and Other Protein Products °Ground beef (85% or leaner), grass-fed beef, or beef trimmed of fat. Skinless chicken or turkey. Ground chicken or turkey. Pork trimmed of fat. All fish and seafood. Eggs. Dried beans, peas, or lentils. Unsalted nuts and seeds. Unsalted canned beans. °Dairy °Low-fat dairy products, such as skim or 1% milk, 2% or reduced-fat cheeses, low-fat ricotta or cottage cheese, or plain low-fat yogurt. Low-sodium or reduced-sodium cheeses. °Fats and Oils °Tub margarines without trans fats. Light or reduced-fat mayonnaise and salad dressings (reduced sodium). Avocado. Safflower, olive, or canola oils. Natural peanut or almond butter. °Other °Unsalted popcorn and pretzels. °The items listed above may not be a complete list of recommended foods or beverages. Contact your dietitian for more options. °WHAT FOODS ARE NOT RECOMMENDED? °Grains °White bread. White pasta. White rice. Refined cornbread. Bagels and croissants. Crackers that contain trans fat. °Vegetables °Creamed or fried vegetables. Vegetables in a cheese sauce. Regular canned vegetables. Regular canned tomato sauce and paste. Regular tomato and vegetable juices. °Fruits °Dried fruits. Canned fruit in light or heavy syrup. Fruit juice. °Meat and Other Protein Products °Fatty cuts of meat. Ribs, chicken wings, bacon, sausage, bologna, salami, chitterlings, fatback, hot dogs, bratwurst, and packaged luncheon meats. Salted nuts and seeds. Canned beans with salt. °Dairy °Whole or 2% milk, cream, half-and-half, and cream cheese. Whole-fat or sweetened yogurt. Full-fat   cheeses or blue cheese. Nondairy creamers and whipped toppings. Processed cheese, cheese spreads, or cheese  curds. °Condiments °Onion and garlic salt, seasoned salt, table salt, and sea salt. Canned and packaged gravies. Worcestershire sauce. Tartar sauce. Barbecue sauce. Teriyaki sauce. Soy sauce, including reduced sodium. Steak sauce. Fish sauce. Oyster sauce. Cocktail sauce. Horseradish. Ketchup and mustard. Meat flavorings and tenderizers. Bouillon cubes. Hot sauce. Tabasco sauce. Marinades. Taco seasonings. Relishes. °Fats and Oils °Butter, stick margarine, lard, shortening, ghee, and bacon fat. Coconut, palm kernel, or palm oils. Regular salad dressings. °Other °Pickles and olives. Salted popcorn and pretzels. °The items listed above may not be a complete list of foods and beverages to avoid. Contact your dietitian for more information. °WHERE CAN I FIND MORE INFORMATION? °National [employer], [employer], [employer]: www.nhlbi.nih.gov/health/health-topics/topics/dash/ °Document Released: 06/07/2011 Document Revised: 11/02/2013 Document Reviewed: 04/22/2013 °ExitCare® Patient Information ©2015 [employer]. This information is not intended to replace advice given to you by your health care provider. Make sure you discuss any questions you have with your health care provider. ° °

## 2014-07-15 NOTE — Progress Notes (Signed)
Pt is here today c/o a boil under her right armpit and a dark spot on her elbow that wont go away. Pt states that her feet are swelling.

## 2014-08-06 ENCOUNTER — Ambulatory Visit: Payer: Self-pay | Attending: Internal Medicine | Admitting: *Deleted

## 2014-08-06 VITALS — BP 123/81 | HR 58 | Temp 98.5°F | Resp 20

## 2014-08-06 DIAGNOSIS — I10 Essential (primary) hypertension: Secondary | ICD-10-CM

## 2014-08-06 DIAGNOSIS — E669 Obesity, unspecified: Secondary | ICD-10-CM | POA: Insufficient documentation

## 2014-08-06 NOTE — Progress Notes (Signed)
Patient presents for BP check Med list reviewed; states taking all meds as directed Discussed need for low sodium diet and using Mrs. Dash as alternative to salt. States she rinses all food and doesn't add or cook with salt Patient states exercising 45 minutes/day 5 days/week at [facility]  BP 123/81 P 58 R 20  T 98.5 oral SPO2  100%  Patient advised to call for med refills at least 7 days before running out so as not to go without. Patient aware that she is to f/u with PCP 3 months from last visit (Due 10/14/14)

## 2014-08-10 ENCOUNTER — Ambulatory Visit: Payer: Self-pay | Attending: Internal Medicine

## 2014-08-18 ENCOUNTER — Other Ambulatory Visit (HOSPITAL_COMMUNITY): Payer: Self-pay | Admitting: Family Medicine

## 2014-08-18 DIAGNOSIS — Z1231 Encounter for screening mammogram for malignant neoplasm of breast: Secondary | ICD-10-CM

## 2014-10-04 ENCOUNTER — Ambulatory Visit (HOSPITAL_COMMUNITY)
Admission: RE | Admit: 2014-10-04 | Discharge: 2014-10-04 | Disposition: A | Discharge status: Home / Self Care | Payer: No Typology Code available for payment source | Source: Ambulatory Visit | Attending: Family Medicine | Admitting: Family Medicine

## 2014-10-04 DIAGNOSIS — Z1231 Encounter for screening mammogram for malignant neoplasm of breast: Secondary | ICD-10-CM | POA: Insufficient documentation

## 2014-10-14 ENCOUNTER — Ambulatory Visit: Payer: No Typology Code available for payment source | Attending: Internal Medicine | Admitting: Internal Medicine

## 2014-10-14 ENCOUNTER — Encounter: Payer: Self-pay | Admitting: Internal Medicine

## 2014-10-14 VITALS — BP 168/76 | HR 60 | Temp 98.8°F | Resp 16 | Ht 63.0 in | Wt 245.0 lb

## 2014-10-14 DIAGNOSIS — I10 Essential (primary) hypertension: Secondary | ICD-10-CM

## 2014-10-14 DIAGNOSIS — M25522 Pain in left elbow: Secondary | ICD-10-CM | POA: Insufficient documentation

## 2014-10-14 DIAGNOSIS — Z1211 Encounter for screening for malignant neoplasm of colon: Secondary | ICD-10-CM | POA: Insufficient documentation

## 2014-10-14 NOTE — Progress Notes (Signed)
Patient ID: Katie Andrews, female   DOB: 1950-09-20, 64 y.o.   MRN: [id number] Subjective:  Katie Andrews is a 64 y.o. female with hypertension.  Patient reports that she takes her medication at night before bed. Current Outpatient Prescriptions  Medication Sig Dispense Refill  . aspirin EC 81 MG tablet Take 81 mg by mouth daily.    Marland Kitchen lisinopril-hydrochlorothiazide (PRINZIDE,ZESTORETIC) 20-25 MG per tablet Take 1 tablet by mouth daily. 30 tablet 4  . fluconazole (DIFLUCAN) 150 MG tablet Take 1 tablet (150 mg total) by mouth once a week. Take one tablet once per week for 4 weeks (Patient not taking: Reported on 08/06/2014) 4 tablet 0  . nystatin cream (MYCOSTATIN) Apply 1 application topically 2 (two) times daily. (Patient not taking: Reported on 08/06/2014) 30 g 1  . sulfamethoxazole-trimethoprim (BACTRIM DS,SEPTRA DS) 800-160 MG per tablet Take 1 tablet by mouth 2 (two) times daily. (Patient not taking: Reported on 08/06/2014) 20 tablet 0  . traMADol (ULTRAM) 50 MG tablet Take 1 tablet (50 mg total) by mouth every 6 (six) hours as needed. (Patient not taking: Reported on 08/06/2014) 15 tablet 0   No current facility-administered medications for this visit.    Hypertension ROS: taking medications as instructed, no medication side effects noted, no TIA's, no chest pain on exertion, no dyspnea on exertion, no swelling of ankles and Been noticing headaches when she eats too much pork.                                     New concerns: Left elbow pain when sleeping. Pain does not bother her in the daytime. Feels like a light ache on her lateral left arm.   Objective:  BP 168/76 mmHg  Pulse 60  Temp(Src) 98.8 F (37.1 C) (Oral)  Resp 16  Ht 5\' 3"  (1.6 m)  Wt 245 lb (111.131 kg)  BMI 43.41 kg/m2  SpO2 95%  Appearance alert, well appearing, and in no distress, oriented to person, place, and time and overweight. General exam BP noted to be elevated in office but has been normal when she takes her  medication in the AM, S1, S2 normal, no gallop, no murmur, chest clear, no JVD, no HSM, no edema, CVS exam  - normal rate, regular rhythm, normal S1, S2, no murmurs, rubs, clicks or gallops, no JVD.  Lab review: labs are reviewed, up to date and normal.   Katie Andrews was seen today for follow-up.  Diagnoses and all orders for this visit:  Essential hypertension I have advised patient to take medication in the AM from this point on for better control throughout the day. No change in regimen. Patients pressure seems to be normal on days she take it in the morning. II have went over dash diet, exercise, and weight loss with patient to help with BP control.  Left elbow pain May use tylenol for pain  Colon cancer screening Orders: -     [facility] COLONOSCOPY  Return in about 3 months (around 01/13/2015).  Katie Manning, NP 10/19/2014 2:03 PM

## 2014-10-14 NOTE — Progress Notes (Signed)
Pt is here following up on her HTN. Pt states that when she sleeps she gets a pain in her left elbow.

## 2015-01-06 ENCOUNTER — Ambulatory Visit: Payer: Commercial Managed Care - HMO | Attending: Internal Medicine | Admitting: Internal Medicine

## 2015-01-06 ENCOUNTER — Encounter: Payer: Self-pay | Admitting: Internal Medicine

## 2015-01-06 VITALS — BP 121/64 | HR 68 | Temp 98.3°F | Ht 63.0 in | Wt 251.0 lb

## 2015-01-06 DIAGNOSIS — Z79899 Other long term (current) drug therapy: Secondary | ICD-10-CM | POA: Insufficient documentation

## 2015-01-06 DIAGNOSIS — I10 Essential (primary) hypertension: Secondary | ICD-10-CM | POA: Diagnosis not present

## 2015-01-06 DIAGNOSIS — Z7982 Long term (current) use of aspirin: Secondary | ICD-10-CM | POA: Insufficient documentation

## 2015-01-06 DIAGNOSIS — Z1211 Encounter for screening for malignant neoplasm of colon: Secondary | ICD-10-CM

## 2015-01-06 MED ORDER — LISINOPRIL-HYDROCHLOROTHIAZIDE 20-25 MG PO TABS: 1.0000 | ORAL_TABLET | Freq: Every day | ORAL | Status: DC

## 2015-01-06 NOTE — Progress Notes (Signed)
Patient ID: Katie Andrews, female   DOB: 1950-09-27, 64 y.o.   MRN: [id number] Subjective:  Katie Andrews is a 64 y.o. female with hypertension.  Current Outpatient Prescriptions  Medication Sig Dispense Refill  . aspirin EC 81 MG tablet Take 81 mg by mouth daily.    Marland Kitchen lisinopril-hydrochlorothiazide (PRINZIDE,ZESTORETIC) 20-25 MG per tablet Take 1 tablet by mouth daily. 30 tablet 4   No current facility-administered medications for this visit.    Hypertension ROS: taking medications as instructed, no medication side effects noted, no TIA's, no chest pain on exertion, no dyspnea on exertion and noting swelling of ankles.  New concerns: She still has not been called for her colonoscopy.   Objective:  BP 121/64 mmHg  Pulse 68  Temp(Src) 98.3 F (36.8 C)  Ht 5\' 3"  (1.6 m)  Wt 251 lb (113.853 kg)  BMI 44.47 kg/m2  SpO2 99%  Appearance alert, well appearing, and in no distress and overweight. General exam BP noted to be well controlled today in office, S1, S2 normal, no gallop, no murmur, chest clear, no JVD, no HSM, no edema.  Lab review: labs are reviewed, up to date. LDL was 109, gaol of below 90.  Assessment:   Hypertension well controlled and needs to follow diet more regularly.   Plan:  Current treatment plan is effective, no change in therapy. Reviewed diet, exercise and weight control. Recommended sodium restriction.   Return in about 6 months (around 07/09/2015) for Hypertension.  Lance Bosch, NP 01/16/2015 7:53 PM

## 2015-01-06 NOTE — Progress Notes (Signed)
Patient here for her HTN She reports no problems

## 2015-01-24 ENCOUNTER — Other Ambulatory Visit: Payer: Self-pay | Admitting: Internal Medicine

## 2015-02-07 ENCOUNTER — Encounter: Payer: Self-pay | Admitting: Internal Medicine

## 2015-08-29 ENCOUNTER — Other Ambulatory Visit: Payer: Self-pay

## 2015-08-29 DIAGNOSIS — Z1231 Encounter for screening mammogram for malignant neoplasm of breast: Secondary | ICD-10-CM

## 2015-08-30 MED FILL — LISINOPRIL-HCTZ 20-25 MG TA: 20-25 | 30 days supply | Qty: 30 | Fill #4

## 2015-09-08 ENCOUNTER — Ambulatory Visit
Admission: RE | Admit: 2015-09-08 | Discharge: 2015-09-08 | Disposition: A | Discharge status: Home / Self Care | Payer: Commercial Managed Care - HMO | Source: Ambulatory Visit

## 2015-09-08 DIAGNOSIS — Z1231 Encounter for screening mammogram for malignant neoplasm of breast: Secondary | ICD-10-CM

## 2015-09-13 ENCOUNTER — Telehealth: Payer: Self-pay

## 2015-09-13 NOTE — Telephone Encounter (Signed)
-----   Message from Lance Bosch, NP sent at 09/12/2015 10:01 PM EDT ----- Negative mammogram.

## 2015-09-13 NOTE — Telephone Encounter (Signed)
Tried to contact patient Patient not available Message left on voice mail to return our call 

## 2015-09-14 ENCOUNTER — Telehealth: Payer: Self-pay

## 2015-09-14 ENCOUNTER — Telehealth: Payer: Self-pay | Admitting: Internal Medicine

## 2015-09-14 NOTE — Telephone Encounter (Signed)
Returned patient phone call Patient not available Message left on voice mail to return our call 

## 2015-09-14 NOTE — Telephone Encounter (Signed)
Pt. Returned call. Please f/u °

## 2015-10-07 ENCOUNTER — Other Ambulatory Visit: Payer: Self-pay | Admitting: Internal Medicine

## 2015-10-19 MED FILL — LISINOPRIL-HCTZ 20-25 MG TA: 20-25 | 30 days supply | Qty: 30 | Fill #0

## 2015-11-09 DIAGNOSIS — H2513 Age-related nuclear cataract, bilateral: Secondary | ICD-10-CM | POA: Diagnosis not present

## 2015-11-22 ENCOUNTER — Other Ambulatory Visit: Payer: Self-pay | Admitting: Internal Medicine

## 2015-11-30 ENCOUNTER — Ambulatory Visit: Payer: Commercial Managed Care - HMO | Admitting: Family Medicine

## 2015-12-27 ENCOUNTER — Other Ambulatory Visit: Payer: Self-pay | Admitting: Internal Medicine

## 2015-12-28 MED FILL — LISINOPRIL-HCTZ 20-25 MG TA: 20-25 | 30 days supply | Qty: 30 | Fill #0

## 2016-02-08 ENCOUNTER — Telehealth: Payer: Self-pay | Admitting: Internal Medicine

## 2016-02-08 ENCOUNTER — Other Ambulatory Visit: Payer: Self-pay | Admitting: Internal Medicine

## 2016-02-08 MED ORDER — LISINOPRIL-HYDROCHLOROTHIAZIDE 20-25 MG PO TABS: 1.0000 | ORAL_TABLET | Freq: Every day | ORAL | 0 refills | Status: DC

## 2016-02-08 MED FILL — LISINOPRIL-HCTZ 20-25 MG TA: 20-25 | 30 days supply | Qty: 30 | Fill #0

## 2016-02-08 NOTE — Telephone Encounter (Signed)
Medication refilled

## 2016-02-08 NOTE — Telephone Encounter (Signed)
Patient is requesting lisinopril. Patient has a[pt 8/30. Please follow up.

## 2016-02-29 ENCOUNTER — Ambulatory Visit: Payer: Commercial Managed Care - HMO | Attending: Family Medicine | Admitting: Family Medicine

## 2016-02-29 ENCOUNTER — Encounter: Payer: Self-pay | Admitting: Family Medicine

## 2016-02-29 VITALS — BP 139/82 | HR 60 | Temp 97.6°F | Ht 63.0 in | Wt 248.2 lb

## 2016-02-29 DIAGNOSIS — D649 Anemia, unspecified: Secondary | ICD-10-CM | POA: Insufficient documentation

## 2016-02-29 DIAGNOSIS — R141 Gas pain: Secondary | ICD-10-CM

## 2016-02-29 DIAGNOSIS — I10 Essential (primary) hypertension: Secondary | ICD-10-CM

## 2016-02-29 DIAGNOSIS — Z6841 Body Mass Index (BMI) 40.0 and over, adult: Secondary | ICD-10-CM | POA: Diagnosis not present

## 2016-02-29 DIAGNOSIS — Z859 Personal history of malignant neoplasm, unspecified: Secondary | ICD-10-CM | POA: Diagnosis not present

## 2016-02-29 DIAGNOSIS — Z7982 Long term (current) use of aspirin: Secondary | ICD-10-CM | POA: Diagnosis not present

## 2016-02-29 MED ORDER — LISINOPRIL-HYDROCHLOROTHIAZIDE 20-25 MG PO TABS: 1.0000 | ORAL_TABLET | Freq: Every day | ORAL | 6 refills | Status: DC

## 2016-02-29 NOTE — Progress Notes (Signed)
Pt needs refill on lisinopril-hctz

## 2016-02-29 NOTE — Progress Notes (Signed)
Subjective:  Patient ID: Katie Andrews, female    DOB: 06-12-1951  Age: 65 y.o. MRN: [id number]  CC: Establish Care   HPI Katie Andrews presents to establish care with me. Medical history is significant for Hypertension, Obesity and she has been compliant with her antihypertensives.  She complains of intermittent right upper quadrant pain whenever she eats which lasts for a few seconds and she is unsure if it is related to meals. Denies nausea, vomiting, diarrhea or constipation.  No Known Allergies  No Known Allergies    Past Medical History:  Diagnosis Date  . Anemia   . Cancer ([place])   . Hypertension     No past surgical history on file.  No Known Allergies   Outpatient Medications Prior to Visit  Medication Sig Dispense Refill  . aspirin EC 81 MG tablet Take 81 mg by mouth daily.    Marland Kitchen lisinopril-hydrochlorothiazide (PRINZIDE,ZESTORETIC) 20-25 MG tablet Take 1 tablet by mouth daily. 30 tablet 0   No facility-administered medications prior to visit.     ROS Review of Systems  Constitutional: Negative for activity change, appetite change and fatigue.  HENT: Negative for congestion, sinus pressure and sore throat.   Eyes: Negative for visual disturbance.  Respiratory: Negative for cough, chest tightness, shortness of breath and wheezing.   Cardiovascular: Negative for chest pain and palpitations.  Gastrointestinal: Positive for abdominal pain (RUQ pain). Negative for abdominal distention and constipation.  Endocrine: Negative for polydipsia.  Genitourinary: Negative for dysuria and frequency.  Musculoskeletal: Negative for arthralgias and back pain.  Skin: Negative for rash.  Neurological: Negative for tremors, light-headedness and numbness.  Hematological: Does not bruise/bleed easily.  Psychiatric/Behavioral: Negative for agitation and behavioral problems.    Objective:  BP 139/82 (BP Location: Left Arm, Patient Position: Sitting, Cuff Size: Large)   Pulse  60   Temp 97.6 F (36.4 C) (Oral)   Ht 5\' 3"  (1.6 m)   Wt 248 lb 3.2 oz (112.6 kg)   SpO2 99%   BMI 43.97 kg/m   BP/Weight 02/29/2016 01/06/2015 123456  Systolic BP XX123456 [id number]  Diastolic BP 82 64 76  Wt. (Lbs) 248.2 251 245  BMI 43.97 44.47 43.41      Physical Exam  Constitutional: She is oriented to person, place, and time. She appears well-developed and well-nourished.  Cardiovascular: Normal rate, normal heart sounds and intact distal pulses.   No murmur heard. Pulmonary/Chest: Effort normal and breath sounds normal. She has no wheezes. She has no rales. She exhibits no tenderness.  Abdominal: Soft. Bowel sounds are normal. She exhibits no distension and no mass. There is no tenderness.  Musculoskeletal: Normal range of motion.  Neurological: She is alert and oriented to person, place, and time.     Assessment & Plan:   1. HYPERTENSION, BENIGN ESSENTIAL Controlled Low-sodium, DASH diet - COMPLETE METABOLIC PANEL WITH GFR; Future - Lipid panel; Future - lisinopril-hydrochlorothiazide (PRINZIDE,ZESTORETIC) 20-25 MG tablet; Take 1 tablet by mouth daily.  Dispense: 30 tablet; Refill: 6  2. Gas pain Advised to keep a food diary as gas pains could be secondary to certain foods Advised to use OTC Gas-X  3. Morbid obesity due to excess calories (HCC) Reduce caloric intake, increase physical activity   Meds ordered this encounter  Medications  . lisinopril-hydrochlorothiazide (PRINZIDE,ZESTORETIC) 20-25 MG tablet    Sig: Take 1 tablet by mouth daily.    Dispense:  30 tablet    Refill:  6    Follow-up: Return in  about 6 months (around 08/29/2016) for Follow-up on hypertension.   Arnoldo Morale MD

## 2016-03-02 ENCOUNTER — Ambulatory Visit: Payer: Commercial Managed Care - HMO | Attending: Family Medicine

## 2016-03-02 DIAGNOSIS — I10 Essential (primary) hypertension: Secondary | ICD-10-CM | POA: Diagnosis not present

## 2016-03-02 LAB — COMPLETE METABOLIC PANEL WITH GFR
ALT: 8 U/L (ref 6–29)
AST: 14 U/L (ref 10–35)
Albumin: 4.3 g/dL (ref 3.6–5.1)
Alkaline Phosphatase: 30 U/L — ABNORMAL LOW (ref 33–130)
BILIRUBIN TOTAL: 1.2 mg/dL (ref 0.2–1.2)
BUN: 15 mg/dL (ref 7–25)
CHLORIDE: 106 mmol/L (ref 98–110)
CO2: 26 mmol/L (ref 20–31)
Calcium: 8.8 mg/dL (ref 8.6–10.4)
Creat: 0.83 mg/dL (ref 0.50–0.99)
GFR, EST AFRICAN AMERICAN: 85 mL/min (ref >=60)
GFR, EST NON AFRICAN AMERICAN: 74 mL/min (ref >=60)
Glucose, Bld: 91 mg/dL (ref 65–99)
POTASSIUM: 4.6 mmol/L (ref 3.5–5.3)
Sodium: 136 mmol/L (ref 135–146)
Total Protein: 7 g/dL (ref 6.1–8.1)

## 2016-03-02 LAB — LIPID PANEL
Cholesterol: 158 mg/dL (ref 125–200)
HDL: 57 mg/dL (ref >=46)
LDL CALC: 88 mg/dL (ref <130)
TRIGLYCERIDES: 66 mg/dL (ref <150)
Total CHOL/HDL Ratio: 2.8 Ratio (ref <=5.0)
VLDL: 13 mg/dL (ref <30)

## 2016-03-02 MED FILL — LISINOPRIL-HCTZ 20-25 MG TA: 20-25 | 30 days supply | Qty: 30 | Fill #0

## 2016-03-13 ENCOUNTER — Telehealth: Payer: Self-pay

## 2016-03-13 NOTE — Telephone Encounter (Signed)
Writer called patient on all three listed numbers.  Was able to LVM on her cell number requesting her to call back to discuss lab results.

## 2016-03-13 NOTE — Telephone Encounter (Signed)
-----   Message from Arnoldo Morale, MD sent at 03/06/2016  2:07 PM EDT ----- Please inform the patient that labs are normal. Thank you.

## 2016-03-14 ENCOUNTER — Telehealth: Payer: Self-pay

## 2016-03-14 NOTE — Telephone Encounter (Signed)
-----   Message from Arnoldo Morale, MD sent at 03/06/2016  2:07 PM EDT ----- Please inform the patient that labs are normal. Thank you.

## 2016-03-14 NOTE — Telephone Encounter (Signed)
Writer called patient per Dr. Jarold Song to let her know that her lab results were all normal.  Patient stated understanding.

## 2016-03-16 LAB — GLUCOSE, POCT (MANUAL RESULT ENTRY): POC Glucose: 138 mg/dl — AB (ref 70–99)

## 2016-04-12 MED FILL — LISINOPRIL-HCTZ 20-25 MG TA: 20-25 | 30 days supply | Qty: 30 | Fill #1

## 2016-07-11 DIAGNOSIS — Z1239 Encounter for other screening for malignant neoplasm of breast: Secondary | ICD-10-CM | POA: Diagnosis not present

## 2016-07-11 DIAGNOSIS — Z13220 Encounter for screening for lipoid disorders: Secondary | ICD-10-CM | POA: Diagnosis not present

## 2016-07-11 DIAGNOSIS — R7303 Prediabetes: Secondary | ICD-10-CM | POA: Diagnosis not present

## 2016-07-11 DIAGNOSIS — I10 Essential (primary) hypertension: Secondary | ICD-10-CM | POA: Diagnosis not present

## 2016-07-11 DIAGNOSIS — L308 Other specified dermatitis: Secondary | ICD-10-CM | POA: Diagnosis not present

## 2016-07-31 ENCOUNTER — Other Ambulatory Visit: Payer: Self-pay | Admitting: Internal Medicine

## 2016-07-31 DIAGNOSIS — Z1231 Encounter for screening mammogram for malignant neoplasm of breast: Secondary | ICD-10-CM

## 2016-09-07 DIAGNOSIS — R7303 Prediabetes: Secondary | ICD-10-CM | POA: Diagnosis not present

## 2016-09-07 DIAGNOSIS — I10 Essential (primary) hypertension: Secondary | ICD-10-CM | POA: Diagnosis not present

## 2016-09-07 DIAGNOSIS — D649 Anemia, unspecified: Secondary | ICD-10-CM | POA: Diagnosis not present

## 2016-09-07 DIAGNOSIS — L308 Other specified dermatitis: Secondary | ICD-10-CM | POA: Diagnosis not present

## 2016-09-12 ENCOUNTER — Ambulatory Visit: Payer: Commercial Managed Care - HMO

## 2016-10-04 ENCOUNTER — Ambulatory Visit
Admission: RE | Admit: 2016-10-04 | Discharge: 2016-10-04 | Disposition: A | Discharge status: Home / Self Care | Payer: Medicare HMO | Source: Ambulatory Visit | Attending: Internal Medicine | Admitting: Internal Medicine

## 2016-10-04 DIAGNOSIS — Z1231 Encounter for screening mammogram for malignant neoplasm of breast: Secondary | ICD-10-CM

## 2017-04-16 DIAGNOSIS — L03317 Cellulitis of buttock: Secondary | ICD-10-CM | POA: Diagnosis not present

## 2017-04-16 DIAGNOSIS — R21 Rash and other nonspecific skin eruption: Secondary | ICD-10-CM | POA: Diagnosis not present

## 2017-04-16 DIAGNOSIS — I10 Essential (primary) hypertension: Secondary | ICD-10-CM | POA: Diagnosis not present

## 2017-06-24 ENCOUNTER — Emergency Department (HOSPITAL_COMMUNITY)
Admission: EM | Admit: 2017-06-24 | Discharge: 2017-06-24 | Disposition: A | Discharge status: Home / Self Care | Payer: Medicare HMO | Attending: Emergency Medicine | Admitting: Emergency Medicine

## 2017-06-24 ENCOUNTER — Encounter (HOSPITAL_COMMUNITY): Payer: Self-pay

## 2017-06-24 DIAGNOSIS — L02821 Furuncle of head [any part, except face]: Secondary | ICD-10-CM | POA: Insufficient documentation

## 2017-06-24 DIAGNOSIS — R21 Rash and other nonspecific skin eruption: Secondary | ICD-10-CM | POA: Diagnosis present

## 2017-06-24 DIAGNOSIS — I10 Essential (primary) hypertension: Secondary | ICD-10-CM | POA: Diagnosis not present

## 2017-06-24 MED ORDER — LIDOCAINE-EPINEPHRINE (PF) 2 %-1:200000 IJ SOLN
10.0000 mL | Freq: Once | INTRAMUSCULAR | Status: AC
  Administered 2017-06-24: 10 mL
  Filled 2017-06-24: qty 20

## 2017-06-24 MED ORDER — ACETAMINOPHEN 325 MG PO TABS
650.0000 mg | ORAL_TABLET | Freq: Once | ORAL | Status: AC
  Administered 2017-06-24: 650 mg via ORAL
  Filled 2017-06-24: qty 2

## 2017-06-24 MED ORDER — HYDROCODONE-ACETAMINOPHEN 5-325 MG PO TABS: 1.0000 | ORAL_TABLET | ORAL | 0 refills | Status: DC | PRN

## 2017-06-24 MED ORDER — DOXYCYCLINE HYCLATE 100 MG PO TABS
100.0000 mg | ORAL_TABLET | Freq: Once | ORAL | Status: AC
  Administered 2017-06-24: 100 mg via ORAL
  Filled 2017-06-24: qty 1

## 2017-06-24 MED ORDER — DOXYCYCLINE HYCLATE 100 MG PO CAPS: 100.0000 mg | ORAL_CAPSULE | Freq: Two times a day (BID) | ORAL | 0 refills | Status: AC

## 2017-06-24 NOTE — ED Triage Notes (Signed)
She c/o having a "sore" on the top of her head since last Mon. She is in no distress. She denies trauma.

## 2017-06-24 NOTE — Discharge Instructions (Signed)
Please see the information and instructions below regarding your visit.  Your diagnoses today include:  1. Furuncle of scalp     Abscesses form when an infection in your skin starts to collect bacteria and white blood cells, walling it off from the rest of your body to protect you from a bigger infection. Risk factors for this type of infection include:  ?Break in the skin ?Diabetes ?Swollen areas  Sometimes the infection starts to spread to surrounding tissue, causing redness and swelling. We call this cellulitis.   Tests performed today include: See side panel of your discharge paperwork for testing performed today. Vital signs are listed at the bottom of these instructions.   Medications prescribed:    Take any prescribed medications only as prescribed, and any over the counter medications only as directed on the packaging.  1. Antibiotic. Doxycycline. Please take all of your antibiotics until finished.   You may develop abdominal discomfort or nausea from the antibiotic. If this occurs, you may take it with food. Some patients also get diarrhea with antibiotics. You may help offset this with probiotics which you can buy or get in yogurt. Do not eat or take the probiotics until 2 hours after your antibiotic. Some women develop vaginal yeast infections after antibiotics. If you develop unusual vaginal discharge after being on this medication, please see your primary care provider.   Some people develop allergies to antibiotics. Symptoms of antibiotic allergy can be mild and include a flat rash and itching. They can also be more serious and include:  ?Hives - Hives are raised, red patches of skin that are usually very itchy.  ?Lip or tongue swelling  ?Trouble swallowing or breathing  ?Blistering of the skin or mouth.  If you have any of these serious symptoms, please seek emergency medical care immediately.  2. Norco. You have been prescribed Norco for pain. This is an opioid pain  medication. You may take this medication every 4-6 hours as needed for pain. Only take this medication if you need it for breakthrough pain.   Do not combine this medication with Tylenol, as it may increase the risk of liver problems.  Do not combine this medication with alcohol.  Please be advised to avoid driving or operating heavy machinery while taking this medication, as it may make you drowsy or impair judgment.    Home care instructions:  Please follow any educational materials contained in this packet.   Some things that may promote healing of your wound and infection include:  Keep the infected area clean and dry. You can take a shower or bath, but be sure to pat the area dry with a towel afterward. Do not put any antibiotic ointments or creams on the area. Reapply a dry gauze dressing any time the bandage has become soaked with drainage, or after cleansing the wound.  Apply warm compresses to the wound 3-4 times daily to encourage drainage.   Return instructions:  Please return to the Emergency Department if you experience worsening symptoms. You should return for reevaluation of your infection if you notice spreading redness, increased swelling, an abscess develops, or you develop signs and symptoms of a systemic illness such as fever and chills.  Please return if you have any other emergent concerns.  Please follow up in 48 hours for wound check in this department.  Additional Information: To find a primary care or specialty doctor please call [phone] or [phone] to access "[place] [facility] Service."  You may also go on the [facility] website at [email or web address]  There are also multiple [facility], [facility] and [facility] practices throughout the [place] that are frequently accepting new patients. You may find a clinic that is close to your home and contact them.  [facility] and [place]  [phone]  [facility] in [place] (also locations in [place] and [place]) - [place] [phone]  [place] [phone]    Your vital signs today were: BP (!) 154/66 (BP Location: Right Arm)    Pulse 69    Temp 98.3 F (36.8 C) (Oral)    Resp 16    SpO2 97%  If your blood pressure (BP) was elevated on multiple readings during this visit above 130 for the top number or above 80 for the bottom number, please have this repeated by your primary care provider within one month. --------------  Thank you for allowing [place] to participate in your care today.

## 2017-06-25 NOTE — ED Provider Notes (Signed)
[facility] DEPT Provider Note   CSN: [id number] Arrival date & time: 06/24/17  1332     History   Chief Complaint Chief Complaint  Patient presents with  . Rash    HPI Katie Andrews is a 66 y.o. female.  HPI   Patient is a 66 y.o. female with a history as below presenting for a "boil" on her scalp for one week. Patient reports it developed suddenly but is becoming progressively more painful. Patient denies any other scalp irritation. Patient denies any erythema extending from the boil. No facial swelling or facial edema. No fever, chills, nausea, or vomiting. Patient has tried placing tea bags on the site which did not produce purulence. Patient has a history of getting boils requiring I & D but has not had any in 2 years. Patient is not diabetic. No history of immunocompromised status.   Past Medical History:  Diagnosis Date  . Anemia   . Cancer ([place])   . Hypertension     Patient Active Problem List   Diagnosis Date Noted  . Essential hypertension 07/15/2014  . BUNION, RIGHT FOOT 06/23/2010  . KNEE PAIN 01/18/2010  . SKIN RASH 08/05/2008  . ACUTE BRONCHITIS 04/06/2008  . ANEMIA 03/24/2008  . Severe obesity (BMI >= 40) ([place]) 03/22/2008  . EDEMA 03/22/2008  . HYPERTENSION, BENIGN ESSENTIAL 09/22/2007  . ALLERGIC RHINITIS 09/22/2007    No past surgical history on file.  OB History    No data available       Home Medications    Prior to Admission medications   Medication Sig Start Date End Date Taking? Authorizing Provider  aspirin EC 81 MG tablet Take 81 mg by mouth daily.   Yes [provider]  ibuprofen (ADVIL,MOTRIN) 200 MG tablet Take 200 mg by mouth every 6 (six) hours as needed.   Yes [provider]  lisinopril-hydrochlorothiazide (PRINZIDE,ZESTORETIC) 20-25 MG tablet Take 1 tablet by mouth daily. 02/29/16  Yes Arnoldo Morale, MD  doxycycline (VIBRAMYCIN) 100 MG capsule Take 1 capsule (100 mg total) by  mouth 2 (two) times daily for 5 days. 06/24/17 06/29/17  Langston Masker B, PA-C  HYDROcodone-acetaminophen (NORCO/VICODIN) 5-325 MG tablet Take 1 tablet by mouth every 4 (four) hours as needed. 06/24/17   Albesa Seen, PA-C    Family History Family History  Problem Relation Age of Onset  . Diabetes Mother   . Hypertension Mother     Social History Social History   Tobacco Use  . Smoking status: Never Smoker  . Smokeless tobacco: Never Used  Substance Use Topics  . Alcohol use: No  . Drug use: No     Allergies   Patient has no known allergies.   Review of Systems Review of Systems  Constitutional: Negative for chills and fever.  HENT: Negative for facial swelling.   Gastrointestinal: Negative for nausea and vomiting.  Skin: Positive for rash.  Allergic/Immunologic: Negative for immunocompromised state.     Physical Exam Updated Vital Signs BP (!) 154/66 (BP Location: Right Arm)   Pulse 69   Temp 98.3 F (36.8 C) (Oral)   Resp 16   SpO2 97%   Physical Exam  Constitutional: She appears well-developed and well-nourished. No distress.  Sitting comfortably in bed.  HENT:  Head: Normocephalic and atraumatic.  Eyes: Conjunctivae are normal. Right eye exhibits no discharge. Left eye exhibits no discharge.  EOMs normal to gross examination.  Neck: Normal range of motion.  Cardiovascular: Normal rate, regular rhythm  and normal heart sounds.  Pulmonary/Chest: Effort normal and breath sounds normal.  Normal respiratory effort. Patient converses comfortably. No audible wheeze or stridor.  Abdominal: She exhibits no distension.  Musculoskeletal: Normal range of motion.  Neurological: She is alert.  Cranial nerves intact to gross observation. Patient moves extremities without difficulty.  Skin: Skin is warm and dry. She is not diaphoretic.  There is approximately 1.5 cm in diameter area of fluctuance overlying the top of the scalp. No surrounding erythema. No  scaling of the scalp. No other lesions of the scalp.  Psychiatric: She has a normal mood and affect. Her behavior is normal. Judgment and thought content normal.  Nursing note and vitals reviewed.    ED Treatments / Results  Labs (all labs ordered are listed, but only abnormal results are displayed) Labs Reviewed - No data to display  EKG  EKG Interpretation None       Radiology No results found.  Procedures .Marland KitchenIncision and Drainage Date/Time: 06/25/2017 2:46 AM Performed by: Albesa Seen, PA-C Authorized by: Albesa Seen, PA-C   Consent:    Consent obtained:  Verbal   Consent given by:  Patient   Risks discussed:  Bleeding, incomplete drainage and pain   Alternatives discussed:  No treatment Location:    Type:  Abscess   Size:  1.6 cm   Location:  Head   Head location:  Scalp Pre-procedure details:    Skin preparation:  Betadine Anesthesia (see MAR for exact dosages):    Anesthesia method:  Local infiltration   Local anesthetic:  Lidocaine 2% WITH epi Procedure type:    Complexity:  Simple Procedure details:    Incision types:  Stab incision   Scalpel blade:  11   Wound management:  Probed and deloculated and irrigated with saline   Drainage:  Purulent   Drainage amount:  Scant   Wound treatment:  Wound left open   Packing materials:  None Post-procedure details:    Patient tolerance of procedure:  Tolerated well, no immediate complications   (including critical care time)  Medications Ordered in ED Medications  acetaminophen (TYLENOL) tablet 650 mg (650 mg Oral Given 06/24/17 1740)  lidocaine-EPINEPHrine (XYLOCAINE W/EPI) 2 %-1:200000 (PF) injection 10 mL (10 mLs Infiltration Given by Other 06/24/17 1740)  doxycycline (VIBRA-TABS) tablet 100 mg (100 mg Oral Given 06/24/17 1940)     Initial Impression / Assessment and Plan / ED Course  I have reviewed the triage vital signs and the nursing notes.  Pertinent labs & imaging results that were  available during my care of the patient were reviewed by me and considered in my medical decision making (see chart for details).     Final Clinical Impressions(s) / ED Diagnoses   Final diagnoses:  Furuncle of scalp   Patient nontoxic appearing, afebrile, and in no acute distress. Patient with furuncle of scalp amenable to I & D. Patient case discussed with Dr. Duffy Bruce. The risks and benefits of I & D versus watchful waiting with warm compresses discussed with patient. I & D performed without complication. Instructed patient to perform warm compresses 3-4 times daily. Doxycycline for further management. I have reviewed the patient's information in the [facility] for the past 12 months and found them to have no Rx. Opiates were prescribed for an acute, painful condition. The patient was given information on side effects and encouraged to use other, non-opiate pain medication primary, only using opiate medicine sparingly for severe pain.  Patient to follow up for wound check in 48 hours in ED as patient does not have PCP. Primary care resources dispensed to patient. Return precautions given for any erythema spreading from the site, reedevelopment of abscess, or fever or chills with skin infection. Patient is in understanding and agrees with the plan of care.    ED Discharge Orders        Ordered    doxycycline (VIBRAMYCIN) 100 MG capsule  2 times daily     06/24/17 1928    HYDROcodone-acetaminophen (NORCO/VICODIN) 5-325 MG tablet  Every 4 hours PRN     06/24/17 1928       Tamala Julian 06/25/17 0252    Duffy Bruce, MD 06/26/17 [phone]

## 2017-06-26 ENCOUNTER — Emergency Department (HOSPITAL_COMMUNITY)
Admission: EM | Admit: 2017-06-26 | Discharge: 2017-06-26 | Disposition: A | Discharge status: Home / Self Care | Payer: Medicare HMO | Attending: Emergency Medicine | Admitting: Emergency Medicine

## 2017-06-26 ENCOUNTER — Encounter (HOSPITAL_COMMUNITY): Payer: Self-pay | Admitting: Emergency Medicine

## 2017-06-26 DIAGNOSIS — L989 Disorder of the skin and subcutaneous tissue, unspecified: Secondary | ICD-10-CM | POA: Insufficient documentation

## 2017-06-26 DIAGNOSIS — Z5321 Procedure and treatment not carried out due to patient leaving prior to being seen by health care provider: Secondary | ICD-10-CM | POA: Insufficient documentation

## 2017-06-26 NOTE — ED Notes (Signed)
Called x3 for pt, no answer.

## 2017-06-26 NOTE — ED Triage Notes (Addendum)
Patient reports she was seen on 12/24 for "boil" on head and told to come back today to be rechecked. Patient speaking in full sentences without difficulty.  Patient hypertensive in triage. Reports history of hypertension and states she has not taken her BP medications today.

## 2017-06-26 NOTE — ED Notes (Signed)
Called back, no answer.

## 2017-08-26 ENCOUNTER — Other Ambulatory Visit: Payer: Self-pay | Admitting: Internal Medicine

## 2017-08-26 DIAGNOSIS — Z1231 Encounter for screening mammogram for malignant neoplasm of breast: Secondary | ICD-10-CM

## 2017-10-07 ENCOUNTER — Ambulatory Visit: Payer: Medicare HMO

## 2017-10-09 ENCOUNTER — Ambulatory Visit
Admission: RE | Admit: 2017-10-09 | Discharge: 2017-10-09 | Disposition: A | Discharge status: Home / Self Care | Payer: Medicare HMO | Source: Ambulatory Visit | Attending: Internal Medicine | Admitting: Internal Medicine

## 2017-10-09 DIAGNOSIS — Z1231 Encounter for screening mammogram for malignant neoplasm of breast: Secondary | ICD-10-CM

## 2018-04-18 DIAGNOSIS — R252 Cramp and spasm: Secondary | ICD-10-CM | POA: Diagnosis not present

## 2018-04-18 DIAGNOSIS — G2581 Restless legs syndrome: Secondary | ICD-10-CM | POA: Diagnosis not present

## 2018-04-18 DIAGNOSIS — I10 Essential (primary) hypertension: Secondary | ICD-10-CM | POA: Diagnosis not present

## 2018-04-18 DIAGNOSIS — D649 Anemia, unspecified: Secondary | ICD-10-CM | POA: Diagnosis not present

## 2018-04-18 DIAGNOSIS — Z79899 Other long term (current) drug therapy: Secondary | ICD-10-CM | POA: Diagnosis not present

## 2018-04-18 DIAGNOSIS — Z008 Encounter for other general examination: Secondary | ICD-10-CM | POA: Diagnosis not present

## 2018-04-18 DIAGNOSIS — Z0000 Encounter for general adult medical examination without abnormal findings: Secondary | ICD-10-CM | POA: Diagnosis not present

## 2018-05-16 ENCOUNTER — Other Ambulatory Visit: Payer: Self-pay | Admitting: Internal Medicine

## 2018-05-16 DIAGNOSIS — E2839 Other primary ovarian failure: Secondary | ICD-10-CM

## 2018-07-16 ENCOUNTER — Ambulatory Visit
Admission: RE | Admit: 2018-07-16 | Discharge: 2018-07-16 | Disposition: A | Discharge status: Home / Self Care | Payer: Medicare HMO | Source: Ambulatory Visit | Attending: Internal Medicine | Admitting: Internal Medicine

## 2018-07-16 DIAGNOSIS — E2839 Other primary ovarian failure: Secondary | ICD-10-CM

## 2018-08-05 ENCOUNTER — Other Ambulatory Visit: Payer: Self-pay | Admitting: Family

## 2018-08-05 ENCOUNTER — Ambulatory Visit
Admission: RE | Admit: 2018-08-05 | Discharge: 2018-08-05 | Disposition: A | Discharge status: Home / Self Care | Payer: Medicare HMO | Source: Ambulatory Visit | Attending: Family | Admitting: Family

## 2018-08-05 DIAGNOSIS — M21952 Unspecified acquired deformity of left thigh: Secondary | ICD-10-CM

## 2018-08-08 ENCOUNTER — Encounter: Payer: Self-pay | Admitting: Family

## 2018-08-08 ENCOUNTER — Other Ambulatory Visit: Payer: Self-pay | Admitting: Family

## 2018-08-08 ENCOUNTER — Other Ambulatory Visit (HOSPITAL_COMMUNITY): Payer: Self-pay | Admitting: Family

## 2018-08-08 DIAGNOSIS — I10 Essential (primary) hypertension: Secondary | ICD-10-CM

## 2018-08-21 ENCOUNTER — Ambulatory Visit (HOSPITAL_COMMUNITY): Payer: Medicare HMO | Attending: Cardiovascular Disease

## 2018-08-21 DIAGNOSIS — I10 Essential (primary) hypertension: Secondary | ICD-10-CM | POA: Insufficient documentation

## 2018-08-29 ENCOUNTER — Other Ambulatory Visit: Payer: Self-pay | Admitting: Family

## 2018-08-29 DIAGNOSIS — Z1231 Encounter for screening mammogram for malignant neoplasm of breast: Secondary | ICD-10-CM

## 2018-10-13 ENCOUNTER — Ambulatory Visit: Payer: Medicare HMO

## 2018-12-01 ENCOUNTER — Ambulatory Visit: Payer: Medicare HMO

## 2019-01-07 ENCOUNTER — Ambulatory Visit
Admission: RE | Admit: 2019-01-07 | Discharge: 2019-01-07 | Disposition: A | Discharge status: Home / Self Care | Payer: Medicare HMO | Source: Ambulatory Visit | Attending: Family | Admitting: Family

## 2019-01-07 DIAGNOSIS — Z1231 Encounter for screening mammogram for malignant neoplasm of breast: Secondary | ICD-10-CM

## 2019-08-23 ENCOUNTER — Ambulatory Visit: Payer: Medicare HMO | Attending: Internal Medicine

## 2019-08-23 DIAGNOSIS — Z23 Encounter for immunization: Secondary | ICD-10-CM | POA: Insufficient documentation

## 2019-08-23 NOTE — Progress Notes (Signed)
   Covid-19 Vaccination Clinic  Name:  Ezella Beas    MRN: [id number] DOB: 05/30/1951  08/23/2019  Ms. Crystal was observed post Covid-19 immunization for 15 minutes without incidence. She was provided with Vaccine Information Sheet and instruction to access the V-Safe system.   Ms. Shellhammer was instructed to call 911 with any severe reactions post vaccine: Marland Kitchen Difficulty breathing  . Swelling of your face and throat  . A fast heartbeat  . A bad rash all over your body  . Dizziness and weakness    Immunizations Administered    Name Date Dose VIS Date Route   Pfizer COVID-19 Vaccine 08/23/2019  8:19 AM 0.3 mL 06/12/2019 Intramuscular   Manufacturer: [place]   Lot: [id number]   [place]: [id number]

## 2019-09-15 ENCOUNTER — Ambulatory Visit: Payer: Medicare HMO | Attending: Internal Medicine

## 2019-09-15 DIAGNOSIS — Z23 Encounter for immunization: Secondary | ICD-10-CM

## 2019-09-15 NOTE — Progress Notes (Signed)
   Covid-19 Vaccination Clinic  Name:  Katie Andrews    MRN: [id number] DOB: 01/14/1951  09/15/2019  [name] was observed post Covid-19 immunization for 15 minutes without incident. She was provided with Vaccine Information Sheet and instruction to access the V-Safe system.   [name] was instructed to call 911 with any severe reactions post vaccine: Marland Kitchen Difficulty breathing  . Swelling of face and throat  . A fast heartbeat  . A bad rash all over body  . Dizziness and weakness   Immunizations Administered    Name Date Dose VIS Date Route   Pfizer COVID-19 Vaccine 09/15/2019  2:28 PM 0.3 mL 06/12/2019 Intramuscular   Manufacturer: [place]   Lot: [id number]   [place]: [id number]

## 2019-09-24 ENCOUNTER — Other Ambulatory Visit: Payer: Self-pay | Admitting: Family

## 2019-09-24 DIAGNOSIS — Z1231 Encounter for screening mammogram for malignant neoplasm of breast: Secondary | ICD-10-CM

## 2020-01-08 ENCOUNTER — Ambulatory Visit
Admission: RE | Admit: 2020-01-08 | Discharge: 2020-01-08 | Disposition: A | Discharge status: Home / Self Care | Payer: Medicare HMO | Source: Ambulatory Visit | Attending: Family | Admitting: Family

## 2020-01-08 ENCOUNTER — Other Ambulatory Visit: Payer: Self-pay

## 2020-01-08 DIAGNOSIS — Z1231 Encounter for screening mammogram for malignant neoplasm of breast: Secondary | ICD-10-CM

## 2020-05-16 NOTE — Progress Notes (Signed)
    SUBJECTIVE:   CHIEF COMPLAINT / HPI:   Patient previously went to [facility], but recently decided to change providers back to the family [facility] clinic after she was prescribed a medication that made her lips swell.  She does not remember the name of the medication, but states that it was "100 mg of something"  HTN: Patient currently takes lisinopril 40 mg once a day.  She may be misses once a week.  She takes the medication at 3 PM.  Headaches: Patient had a Covid booster shot last month on October 16.  Since that time she has daily waxing and waning headaches that she describes as "very painful".  They are frontal and bilateral.  Patient denies sinus issues.  Denies a history of migraines or other headaches.  She has been taking over-the-counter pain medication, but she does not know the specific name of it.  She denies photophobia, phonophobia, nausea or vomiting, or vision changes.  Patient also noticed bilateral foot swelling after the Covid shot, but this improved.  She recently started taking a over-the-counter "water pill".  She is not sure the name of it but thinks it was diuretics.  She does not know the ingredients.  Anemia: Patient takes iron for history of anemia.  Patient takes vitamin D3 (1000 IU) and calcium 600 mg daily.  PERTINENT  PMH / PSH: HTN  OBJECTIVE:   BP (!) 170/64   Pulse 61   Ht 5\' 3"  (1.6 m)   Wt 252 lb (114.3 kg)   SpO2 95%   BMI 44.64 kg/m   General: Alert, oriented.  Elderly woman appears stated age.  No acute distress. HEENT: PERRLA.  Moist oral mucosa.  Enlarged hard palate. Neck: No thyromegaly or nodules. CV: Regular rate and rhythm, no murmurs Pulmonary: Lungs clear auscultation bilaterally. 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  No pitting edema.  Foot exam normal.  ASSESSMENT/PLAN:   HYPERTENSION, BENIGN ESSENTIAL Patient takes lisinopril 40 mg.  She does not believe the facial swelling occurred with this  medication.  Prior to this, her listed medication was lisinopril/HCTZ.  Patient has very wide pulse pressure, 170/64.  On repeat it was**.  No changes medication today, continue to monitor  Nonintractable headache Although I doubt the Covid booster and the headaches are related, patient reports that they started after her booster shot.  Given the new onset of daily headaches in a 69 year old woman with no history of headache, we will get an MRI of her brain to rule out any serious causes of headache.  Pending the BMP, we will do with and without contrast.  Advised patient she can take Tylenol 500 mg up to 4 times a day.  ANEMIA Patient has a chronic, unspecified anemia for which she takes over-the-counter iron supplement.  Will get CBC today.     Benay Pike, MD [place]

## 2020-05-17 ENCOUNTER — Encounter: Payer: Self-pay | Admitting: Family Medicine

## 2020-05-17 ENCOUNTER — Other Ambulatory Visit: Payer: Self-pay

## 2020-05-17 ENCOUNTER — Ambulatory Visit (INDEPENDENT_AMBULATORY_CARE_PROVIDER_SITE_OTHER): Payer: Medicare HMO | Admitting: Family Medicine

## 2020-05-17 VITALS — BP 170/64 | HR 61 | Ht 63.0 in | Wt 252.0 lb

## 2020-05-17 DIAGNOSIS — M270 Developmental disorders of jaws: Secondary | ICD-10-CM | POA: Diagnosis not present

## 2020-05-17 DIAGNOSIS — I10 Essential (primary) hypertension: Secondary | ICD-10-CM

## 2020-05-17 DIAGNOSIS — R519 Headache, unspecified: Secondary | ICD-10-CM

## 2020-05-17 DIAGNOSIS — D649 Anemia, unspecified: Secondary | ICD-10-CM

## 2020-05-17 LAB — POCT GLYCOSYLATED HEMOGLOBIN (HGB A1C): Hemoglobin A1C: 4.8 % (ref 4.0–5.6)

## 2020-05-17 NOTE — Patient Instructions (Addendum)
It was nice to meet you today,  We did the following things today: -For your headaches, I want to get a MRI of your head.  We will have to get this approved and then schedule it for you.  Someone will call you when it is scheduled. -For your headaches, please take Tylenol 500 mg at a time up to 4 times a day.  Please do not take the other over-the-counter pain relieving medications. -Please do not take the over-the-counter "water pill".  This may have caffeine and can make your headaches worse -We get some lab work to check your kidney function, your anemia, and to check for diabetes.  I would like to follow-up with you in 4 to 6 weeks.  Have a great day,  Clemetine Marker, MD

## 2020-05-17 NOTE — Assessment & Plan Note (Signed)
Patient has a chronic, unspecified anemia for which she takes over-the-counter iron supplement.  Will get CBC today.

## 2020-05-17 NOTE — Assessment & Plan Note (Signed)
Although I doubt the Covid booster and the headaches are related, patient reports that they started after her booster shot.  Given the new onset of daily headaches in a 69 year old woman with no history of headache, we will get an MRI of her brain to rule out any serious causes of headache.  Pending the BMP, we will do with and without contrast.  Advised patient she can take Tylenol 500 mg up to 4 times a day.

## 2020-05-17 NOTE — Assessment & Plan Note (Signed)
Patient takes lisinopril 40 mg.  She does not believe the facial swelling occurred with this medication.  Prior to this, her listed medication was lisinopril/HCTZ.  Patient has very wide pulse pressure, 170/64.  On repeat it was**.  No changes medication today, continue to monitor

## 2020-05-18 LAB — BASIC METABOLIC PANEL
BUN/Creatinine Ratio: 14 (ref 12–28)
BUN: 11 mg/dL (ref 8–27)
CO2: 23 mmol/L (ref 20–29)
Calcium: 8.9 mg/dL (ref 8.7–10.3)
Chloride: 105 mmol/L (ref 96–106)
Creatinine, Ser: 0.81 mg/dL (ref 0.57–1.00)
GFR calc Af Amer: 85 mL/min/{1.73_m2} (ref >59)
GFR calc non Af Amer: 74 mL/min/{1.73_m2} (ref >59)
Glucose: 92 mg/dL (ref 65–99)
Potassium: 4 mmol/L (ref 3.5–5.2)
Sodium: 139 mmol/L (ref 134–144)

## 2020-05-18 LAB — CBC
Hematocrit: 32.2 % — ABNORMAL LOW (ref 34.0–46.6)
Hemoglobin: 9.2 g/dL — ABNORMAL LOW (ref 11.1–15.9)
MCH: 17.3 pg — ABNORMAL LOW (ref 26.6–33.0)
MCHC: 28.6 g/dL — ABNORMAL LOW (ref 31.5–35.7)
MCV: 61 fL — ABNORMAL LOW (ref 79–97)
Platelets: 84 10*3/uL — CL (ref 150–450)
RBC: 5.32 x10E6/uL — ABNORMAL HIGH (ref 3.77–5.28)
RDW: 28.3 % — ABNORMAL HIGH (ref 11.7–15.4)
WBC: 3.5 10*3/uL (ref 3.4–10.8)

## 2020-05-19 ENCOUNTER — Other Ambulatory Visit: Payer: Self-pay | Admitting: Family Medicine

## 2020-05-19 DIAGNOSIS — D649 Anemia, unspecified: Secondary | ICD-10-CM

## 2020-05-24 ENCOUNTER — Other Ambulatory Visit: Payer: Self-pay | Admitting: Family Medicine

## 2020-05-24 ENCOUNTER — Telehealth: Payer: Self-pay | Admitting: *Deleted

## 2020-05-24 DIAGNOSIS — D649 Anemia, unspecified: Secondary | ICD-10-CM

## 2020-05-24 NOTE — Telephone Encounter (Signed)
LVM on pts mobile and home phone to call office to inform her that her MRI is scheduled on 05/30/2020 @ [facility].  She should arrive at 3:30pm. She can enter through the main entrance of the hospital and they will instruct her where to go. Katleen Carraway Zimmerman Rumple, CMA

## 2020-05-24 NOTE — Telephone Encounter (Signed)
-----   Message from Benay Pike, MD sent at 05/19/2020  7:32 AM EST ----- This pt needs an MRI approved and scheduled.

## 2020-05-25 ENCOUNTER — Other Ambulatory Visit: Payer: Self-pay

## 2020-05-25 ENCOUNTER — Other Ambulatory Visit: Payer: Medicare HMO

## 2020-05-25 ENCOUNTER — Other Ambulatory Visit: Payer: Self-pay | Admitting: Family Medicine

## 2020-05-25 DIAGNOSIS — D649 Anemia, unspecified: Secondary | ICD-10-CM

## 2020-05-25 NOTE — Telephone Encounter (Signed)
Spoke to pt. She is calling the hospital to reschedule MRI. She will be at work and can't make the Monday appt. I gave pt the phone number to call and reschedule.  Ottis Stain, CMA

## 2020-05-26 LAB — CBC WITH DIFFERENTIAL
EOS (ABSOLUTE): 0.1 10*3/uL (ref 0.0–0.4)
Hemoglobin: 9.2 g/dL — ABNORMAL LOW (ref 11.1–15.9)
Immature Grans (Abs): 0 10*3/uL (ref 0.0–0.1)
Monocytes Absolute: 0.3 10*3/uL (ref 0.1–0.9)
Neutrophils: 37 %
RBC: 5.34 x10E6/uL — ABNORMAL HIGH (ref 3.77–5.28)

## 2020-05-26 LAB — HGB FRACTIONATION CASCADE

## 2020-05-26 LAB — SPECIMEN STATUS

## 2020-05-26 LAB — IRON,TIBC AND FERRITIN PANEL
Ferritin: 381 ng/mL — ABNORMAL HIGH (ref 15–150)
Iron Saturation: 26 % (ref 15–55)

## 2020-05-26 LAB — SMEAR REVIEW PER PROTOCOL

## 2020-05-26 LAB — HEPATIC FUNCTION PANEL
AST: 13 IU/L (ref 0–40)
Total Protein: 6.8 g/dL (ref 6.0–8.5)

## 2020-05-27 LAB — SPECIMEN STATUS REPORT

## 2020-05-30 ENCOUNTER — Ambulatory Visit (HOSPITAL_COMMUNITY): Payer: Medicare HMO

## 2020-05-31 LAB — HGB FRACTIONATION CASCADE: Hgb A2: 1.3 % — ABNORMAL LOW (ref 1.8–3.2)

## 2020-05-31 LAB — IRON,TIBC AND FERRITIN PANEL
Iron: 52 ug/dL (ref 27–139)
Total Iron Binding Capacity: 197 ug/dL — ABNORMAL LOW (ref 250–450)
UIBC: 145 ug/dL (ref 118–369)

## 2020-05-31 LAB — HGB FRACTIONATION BY HPLC
Hgb A: 97.9 % (ref 96.4–98.8)
Hgb C: 0 %
Hgb E: 0 %
Hgb F: 0 % (ref 0.0–2.0)
Hgb S: 0 %
Hgb Variant: 0.8 % — ABNORMAL HIGH

## 2020-05-31 LAB — CBC WITH DIFFERENTIAL
Basophils Absolute: 0 10*3/uL (ref 0.0–0.2)
Basos: 0 %
Eos: 2 %
Hematocrit: 31.9 % — ABNORMAL LOW (ref 34.0–46.6)
Immature Granulocytes: 0 %
Lymphocytes Absolute: 1.5 10*3/uL (ref 0.7–3.1)
Lymphs: 51 %
MCH: 17.2 pg — ABNORMAL LOW (ref 26.6–33.0)
MCHC: 28.8 g/dL — ABNORMAL LOW (ref 31.5–35.7)
MCV: 60 fL — ABNORMAL LOW (ref 79–97)
Monocytes: 10 %
Neutrophils Absolute: 1.1 10*3/uL — ABNORMAL LOW (ref 1.4–7.0)
RDW: 28 % — ABNORMAL HIGH (ref 11.7–15.4)
WBC: 2.9 10*3/uL — ABNORMAL LOW (ref 3.4–10.8)

## 2020-05-31 LAB — HEPATIC FUNCTION PANEL
ALT: 11 IU/L (ref 0–32)
Albumin: 4.4 g/dL (ref 3.8–4.8)
Alkaline Phosphatase: 35 IU/L — ABNORMAL LOW (ref 44–121)
Bilirubin Total: 1.3 mg/dL — ABNORMAL HIGH (ref 0.0–1.2)
Bilirubin, Direct: 0.36 mg/dL (ref 0.00–0.40)

## 2020-06-01 ENCOUNTER — Ambulatory Visit (HOSPITAL_COMMUNITY): Payer: Medicare HMO

## 2020-07-04 ENCOUNTER — Ambulatory Visit: Payer: Medicare HMO | Admitting: Family Medicine

## 2020-07-04 NOTE — Progress Notes (Deleted)
    SUBJECTIVE:   CHIEF COMPLAINT / HPI:   Headaches: no mri yet  Htn: pulse pressure  Flu vax?   PERTINENT  PMH / PSH: ***  OBJECTIVE:   There were no vitals taken for this visit.  ***  ASSESSMENT/PLAN:   No problem-specific Assessment & Plan notes found for this encounter.     Sandre Kitty, MD [facility]

## 2020-07-20 ENCOUNTER — Ambulatory Visit: Payer: Medicare HMO | Admitting: Family Medicine

## 2020-07-20 NOTE — Progress Notes (Deleted)
    SUBJECTIVE:   CHIEF COMPLAINT / HPI:   Heqdache: mri never done.   HTN:   Pancytopenia: alpha thal intermedia.   Flu and pna vax  PERTINENT  PMH / PSH: ***  OBJECTIVE:   There were no vitals taken for this visit.  ***  ASSESSMENT/PLAN:   No problem-specific Assessment & Plan notes found for this encounter.     Benay Pike, MD [place]

## 2020-08-01 ENCOUNTER — Other Ambulatory Visit: Payer: Self-pay

## 2020-08-01 ENCOUNTER — Ambulatory Visit (INDEPENDENT_AMBULATORY_CARE_PROVIDER_SITE_OTHER): Payer: Medicare Other | Admitting: Family Medicine

## 2020-08-01 ENCOUNTER — Encounter: Payer: Self-pay | Admitting: Family Medicine

## 2020-08-01 VITALS — HR 55 | Ht 63.0 in | Wt 245.2 lb

## 2020-08-01 DIAGNOSIS — R202 Paresthesia of skin: Secondary | ICD-10-CM | POA: Diagnosis not present

## 2020-08-01 DIAGNOSIS — R519 Headache, unspecified: Secondary | ICD-10-CM | POA: Diagnosis not present

## 2020-08-01 DIAGNOSIS — J3089 Other allergic rhinitis: Secondary | ICD-10-CM | POA: Diagnosis not present

## 2020-08-01 MED ORDER — FLUTICASONE PROPIONATE 50 MCG/ACT NA SUSP: 1.0000 | Freq: Every day | NASAL | 2 refills | Status: AC

## 2020-08-01 NOTE — Patient Instructions (Signed)
It was nice to see you again today,  I have given you some exercises to do at home to strengthen your leg.  We will hold off on getting x-rays right now.  If it is still bothering you in a month from now we can get imaging.  I will prescribe Flonase as well.  This is also a medicine you will spray into each nostril once a day.  If you are still having issues after using this for the next month we can consider a referral for further testing.  Continue taking the Tylenol for the headaches as needed.  I would like to see you back in 1 month.  Have a great day,  Clemetine Marker, MD

## 2020-08-01 NOTE — Progress Notes (Signed)
    SUBJECTIVE:   CHIEF COMPLAINT / HPI:   Headache: Patient's headaches improved.  She never got her MRI.  Headaches are now only once or twice a month and usually only if she eats pork.  She takes Tylenol 500 mg as needed for headache.  Left leg feels like it weighs 100 lbs when she wakes up in the morning..  This started last month.  Notices it every day when she wakes up.  No leg or back pain.  Itches feels numb and heavy.  Improves after she walks on it for the first time.  Sleeps on her right side  But wakes up on her back.  Hasn't fallen yet as a result of the weakness.  Used to work out but has not been to the gym since 2019 due to [employer].    Sinuses: Feels like the left side of her sinuses have been clogged since this past summer.  She is not taking Flonase but is taking Zyrtec nasal spray.  No rhinorrhea or nasal discharge.  PERTINENT  PMH / PSH: HTN, anemia  OBJECTIVE:   Pulse (!) 55   Ht 5\' 3"  (1.6 m)   Wt 245 lb 3.2 oz (111.2 kg)   SpO2 97%   BMI 43.44 kg/m   General: Alert and oriented.  No acute distress HEENT: Nasal passages normal bilaterally CV: Regular rate and rhythm, no murmurs Pulmonary: Lungs clear auscultation bilaterally MSK: Normal range of motion in the lower extremities bilaterally.  No tenderness to palpation on the greater trochanter, spine, pelvis.  Normal FADIR and FABER test.  ASSESSMENT/PLAN:   Nonintractable headache Much improved since last visit.  We will hold off on further work-up.  Continue to use Tylenol as needed.  Allergic rhinitis Suspect her complaints of sinusitis are related to her allergies.  We will add Flonase to her second-generation antihistamine.  Left leg paresthesias Physical exam appeared normal.  Does not appear to be interfering with her functionality and appears to go away soon after waking up.  Most likely due to positional nerve compression that occurs while sleeping.  Showed patient how to do leg strengthening  exercises.  If worsening on next visit we can consider imaging such as x-ray.     Benay Pike, MD [place]

## 2020-08-03 DIAGNOSIS — R202 Paresthesia of skin: Secondary | ICD-10-CM | POA: Insufficient documentation

## 2020-08-03 NOTE — Assessment & Plan Note (Signed)
Suspect her complaints of sinusitis are related to her allergies.  We will add Flonase to her second-generation antihistamine.

## 2020-08-03 NOTE — Assessment & Plan Note (Signed)
Physical exam appeared normal.  Does not appear to be interfering with her functionality and appears to go away soon after waking up.  Most likely due to positional nerve compression that occurs while sleeping.  Showed patient how to do leg strengthening exercises.  If worsening on next visit we can consider imaging such as x-ray.

## 2020-08-03 NOTE — Assessment & Plan Note (Signed)
Much improved since last visit.  We will hold off on further work-up.  Continue to use Tylenol as needed.

## 2020-08-29 ENCOUNTER — Other Ambulatory Visit: Payer: Self-pay

## 2020-08-29 ENCOUNTER — Ambulatory Visit (INDEPENDENT_AMBULATORY_CARE_PROVIDER_SITE_OTHER): Payer: Medicare Other | Admitting: Family Medicine

## 2020-08-29 ENCOUNTER — Encounter: Payer: Self-pay | Admitting: Family Medicine

## 2020-08-29 VITALS — BP 175/80 | HR 50 | Ht 62.0 in | Wt 247.4 lb

## 2020-08-29 DIAGNOSIS — D61818 Other pancytopenia: Secondary | ICD-10-CM

## 2020-08-29 DIAGNOSIS — D560 Alpha thalassemia: Secondary | ICD-10-CM

## 2020-08-29 DIAGNOSIS — R202 Paresthesia of skin: Secondary | ICD-10-CM

## 2020-08-29 DIAGNOSIS — I10 Essential (primary) hypertension: Secondary | ICD-10-CM | POA: Diagnosis not present

## 2020-08-29 MED ORDER — AMLODIPINE BESYLATE 5 MG PO TABS: 5.0000 mg | ORAL_TABLET | Freq: Every day | ORAL | 3 refills | Status: DC

## 2020-08-29 NOTE — Progress Notes (Signed)
    SUBJECTIVE:   CHIEF COMPLAINT / HPI:   Patient states that her heaviness in her leg that was present last time has resolved.  Only has minimal aching in the left ankle.  She has been doing the exercises we talked to her about last time and she says these really help.  Patient feels like her nostrils are "weak".  Has a history of allergies and is taking Flonase and second-generation antihistamine.  States that adding Flonase helped somewhat last time.  Does not want to make any changes today.  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  Htn: Patient taking her medication.  Maybe misses 1 dose a week.  Takes the medication at night.   PERTINENT  PMH / PSH: HTN  OBJECTIVE:   BP (!) 175/80   Pulse (!) 50   Ht 5\' 2"  (1.575 m)   Wt 247 lb 6.4 oz (112.2 kg)   SpO2 98%   BMI 45.25 kg/m   General: Alert and oriented.  No acute distress HEENT: Normal appearing nares, septum, and turbinates. CV: Regular rate and rhythm, Pulmonary: Scattered auscultation bilaterally MSK: No swelling of the ankles and calves bilaterally other than lipedema.  Minimal tenderness of the left ankle.  ASSESSMENT/PLAN:   Left leg paresthesias Resolved.  Patient attributes this to the exercises we prescribed last visit.  Minimal "ache" in her ankle most likely to be arthritis.  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  Also the smear appears to have never resulted so we will repeat that.  HYPERTENSION, BENIGN ESSENTIAL Had previously been resistant to adding new medications due to low diastolic, but today diastolic was 80 and systolic was 841.  Adding amlodipine 5 mg.  We will recheck at next visit.     Benay Pike, MD [place]

## 2020-08-29 NOTE — Patient Instructions (Signed)
It was nice to see you again today,  I have given you the handicap application form.  I have started amlodipine for you.  Take this with your other medication for blood pressure.  Let us know if you start feeling lightheaded when taking this medicine.  I have ordered some blood tests regarding your anemia.  I will follow up with you with results when I get them.  I would like to see you back in approximately 1 month.  Have a great day,  Clemetine Marker, MD

## 2020-08-30 LAB — COMPREHENSIVE METABOLIC PANEL
ALT: 10 IU/L (ref 0–32)
AST: 15 IU/L (ref 0–40)
Albumin/Globulin Ratio: 1.6 (ref 1.2–2.2)
Albumin: 4.4 g/dL (ref 3.8–4.8)
Alkaline Phosphatase: 46 IU/L (ref 44–121)
BUN/Creatinine Ratio: 21 (ref 12–28)
BUN: 17 mg/dL (ref 8–27)
Bilirubin Total: 1.2 mg/dL (ref 0.0–1.2)
CO2: 21 mmol/L (ref 20–29)
Calcium: 9.1 mg/dL (ref 8.7–10.3)
Chloride: 104 mmol/L (ref 96–106)
Creatinine, Ser: 0.8 mg/dL (ref 0.57–1.00)
Globulin, Total: 2.8 g/dL (ref 1.5–4.5)
Glucose: 90 mg/dL (ref 65–99)
Potassium: 3.8 mmol/L (ref 3.5–5.2)
Sodium: 139 mmol/L (ref 134–144)
Total Protein: 7.2 g/dL (ref 6.0–8.5)
eGFR: 79 mL/min/{1.73_m2} (ref >59)

## 2020-08-30 LAB — CBC WITH DIFFERENTIAL
Basophils Absolute: 0 10*3/uL (ref 0.0–0.2)
Basos: 0 %
EOS (ABSOLUTE): 0.1 10*3/uL (ref 0.0–0.4)
Eos: 1 %
Hematocrit: 31.5 % — ABNORMAL LOW (ref 34.0–46.6)
Hemoglobin: 9.4 g/dL — ABNORMAL LOW (ref 11.1–15.9)
Immature Grans (Abs): 0 10*3/uL (ref 0.0–0.1)
Immature Granulocytes: 0 %
Lymphocytes Absolute: 2 10*3/uL (ref 0.7–3.1)
Lymphs: 41 %
MCH: 17.4 pg — ABNORMAL LOW (ref 26.6–33.0)
MCHC: 29.8 g/dL — ABNORMAL LOW (ref 31.5–35.7)
MCV: 58 fL — ABNORMAL LOW (ref 79–97)
Monocytes Absolute: 0.4 10*3/uL (ref 0.1–0.9)
Monocytes: 8 %
Neutrophils Absolute: 2.4 10*3/uL (ref 1.4–7.0)
Neutrophils: 50 %
RBC: 5.41 x10E6/uL — ABNORMAL HIGH (ref 3.77–5.28)
RDW: 27.3 % — ABNORMAL HIGH (ref 11.7–15.4)
WBC: 4.9 10*3/uL (ref 3.4–10.8)

## 2020-08-30 LAB — LACTATE DEHYDROGENASE: LDH: 200 IU/L (ref 119–226)

## 2020-08-30 LAB — RETICULOCYTES: Retic Ct Pct: 2.2 % (ref 0.6–2.6)

## 2020-08-30 LAB — VITAMIN B12: Vitamin B-12: 888 pg/mL (ref 232–1245)

## 2020-08-30 LAB — PATHOLOGIST SMEAR REVIEW: Platelets: 147 10*3/uL — ABNORMAL LOW (ref 150–450)

## 2020-08-31 LAB — PATHOLOGIST SMEAR REVIEW
Basophils Absolute: 0 10*3/uL (ref 0.0–0.2)
Basos: 0 %
EOS (ABSOLUTE): 0.1 10*3/uL (ref 0.0–0.4)
Eos: 1 %
Hematocrit: 32 % — ABNORMAL LOW (ref 34.0–46.6)
Hemoglobin: 9.6 g/dL — ABNORMAL LOW (ref 11.1–15.9)
Immature Grans (Abs): 0 10*3/uL (ref 0.0–0.1)
Immature Granulocytes: 0 %
Lymphocytes Absolute: 1.9 10*3/uL (ref 0.7–3.1)
Lymphs: 40 %
MCH: 17.6 pg — ABNORMAL LOW (ref 26.6–33.0)
MCHC: 30 g/dL — ABNORMAL LOW (ref 31.5–35.7)
MCV: 59 fL — ABNORMAL LOW (ref 79–97)
Monocytes Absolute: 0.4 10*3/uL (ref 0.1–0.9)
Monocytes: 9 %
Neutrophils Absolute: 2.4 10*3/uL (ref 1.4–7.0)
Neutrophils: 50 %
RBC: 5.44 x10E6/uL — ABNORMAL HIGH (ref 3.77–5.28)
RDW: 27.1 % — ABNORMAL HIGH (ref 11.7–15.4)
WBC: 4.8 10*3/uL (ref 3.4–10.8)

## 2020-08-31 NOTE — Assessment & Plan Note (Signed)
Getting further work-up of this as patient had pancytopenia on last visit which was new for her.  Also the smear appears to have never resulted so we will repeat that.

## 2020-08-31 NOTE — Assessment & Plan Note (Signed)
Had previously been resistant to adding new medications due to low diastolic, but today diastolic was 80 and systolic was 357.  Adding amlodipine 5 mg.  We will recheck at next visit.

## 2020-08-31 NOTE — Assessment & Plan Note (Signed)
Resolved.  Patient attributes this to the exercises we prescribed last visit.  Minimal "ache" in her ankle most likely to be arthritis.

## 2020-09-07 ENCOUNTER — Encounter: Payer: Self-pay | Admitting: Family Medicine

## 2020-09-28 ENCOUNTER — Encounter: Payer: Self-pay | Admitting: Family Medicine

## 2020-09-28 ENCOUNTER — Ambulatory Visit (INDEPENDENT_AMBULATORY_CARE_PROVIDER_SITE_OTHER): Payer: Medicare Other | Admitting: Family Medicine

## 2020-09-28 ENCOUNTER — Other Ambulatory Visit: Payer: Self-pay

## 2020-09-28 VITALS — BP 162/70 | HR 54 | Ht 62.0 in | Wt 247.4 lb

## 2020-09-28 DIAGNOSIS — D560 Alpha thalassemia: Secondary | ICD-10-CM

## 2020-09-28 DIAGNOSIS — R0989 Other specified symptoms and signs involving the circulatory and respiratory systems: Secondary | ICD-10-CM

## 2020-09-28 DIAGNOSIS — I10 Essential (primary) hypertension: Secondary | ICD-10-CM

## 2020-09-28 DIAGNOSIS — E669 Obesity, unspecified: Secondary | ICD-10-CM

## 2020-09-28 NOTE — Patient Instructions (Addendum)
It was nice to see you today,  We did the following things today: -I will refer you to hematology for your hemoglobin H disease.  They may choose to do further work-up if necessary. -For your elevated blood pressure and wide pulse pressure I will get an echocardiogram and refer you to the cardiologist.   Please follow-up with me in 3 to 4 weeks.  Have a great day,  Clemetine Marker, MD

## 2020-09-28 NOTE — Progress Notes (Signed)
    SUBJECTIVE:   CHIEF COMPLAINT / HPI:   Patient does not have any complaints today.  She is just following up from her last visit in which we started blood pressure medication.  Patient willing to go to the hematologist for her LVH disease.  She has been taking her amlodipine daily.  Patient states she has no significant cardiac history.  PERTINENT  PMH / PSH: HTN, hemoglobin H  OBJECTIVE:   BP (!) 162/70   Pulse (!) 54   Ht 5\' 2"  (1.575 m)   Wt 247 lb 6 oz (112.2 kg)   SpO2 98%   BMI 45.25 kg/m   General: Alert and oriented.  No acute distress CV: Regular rate and rhythm, no murmurs Pulmonary: Lungs clear auscultation bilaterally   ASSESSMENT/PLAN:   HYPERTENSION, BENIGN ESSENTIAL Pulse pressure still elevated with elevated systolic.  On machine it was 162/70, but when first taken manually the systolic was even more elevated with wider pulse pressure.  Patient denies any previous cardiac disease.  We will continue amlodipine, referred to cardiology, and get echocardiogram.  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     Benay Pike, MD [place]

## 2020-09-29 LAB — LIPID PANEL
Chol/HDL Ratio: 3 ratio (ref 0.0–4.4)
Cholesterol, Total: 175 mg/dL (ref 100–199)
HDL: 59 mg/dL (ref >39)
LDL Chol Calc (NIH): 104 mg/dL — ABNORMAL HIGH (ref 0–99)
Triglycerides: 64 mg/dL (ref 0–149)
VLDL Cholesterol Cal: 12 mg/dL (ref 5–40)

## 2020-09-30 DIAGNOSIS — E669 Obesity, unspecified: Secondary | ICD-10-CM | POA: Insufficient documentation

## 2020-09-30 DIAGNOSIS — R0989 Other specified symptoms and signs involving the circulatory and respiratory systems: Secondary | ICD-10-CM | POA: Insufficient documentation

## 2020-09-30 NOTE — Assessment & Plan Note (Signed)
Pulse pressure still elevated with elevated systolic.  On machine it was 162/70, but when first taken manually the systolic was even more elevated with wider pulse pressure.  Patient denies any previous cardiac disease.  We will continue amlodipine, referred to cardiology, and get echocardiogram.

## 2020-09-30 NOTE — Assessment & Plan Note (Signed)
Will refer to hematology for further evaluation.  

## 2020-10-04 ENCOUNTER — Telehealth: Payer: Self-pay | Admitting: Hematology

## 2020-10-04 NOTE — Telephone Encounter (Signed)
Received a new hem referral from Dr. Gwendlyn Deutscher for Hemoglobin H disease. [name] returned my call and has been scheduled to see Dr. Irene Limbo on 4/6 at 1pm. Pt aware to arrive 15 minutes early.

## 2020-10-04 NOTE — Progress Notes (Signed)
HEMATOLOGY/ONCOLOGY CONSULTATION NOTE  Date of Service: 10/05/2020  Patient Care Team: Benay Pike, MD as PCP - General (Family Medicine)  CHIEF COMPLAINTS/PURPOSE OF CONSULTATION:  Hemoglobin H Disease  HISTORY OF PRESENTING ILLNESS:   Katie Andrews is a wonderful 70 y.o. female who has been referred to [place] by Dr. 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 The pt reports that he is doing well overall.  The pt reports that she has bene anemic for all of her life. The pt notes no recent changes and notes that nothing changed that triggered the referral. She is unsure of why she was referred just now. The pt notes that she recently switched to Dr. Jeannine Kitten as her PCP and that is most likely the referral cause. The pt notes that she has not needed blood transfusions in the past or IV iron, just OTC Iron pills. The pt notes the last time she was on iron supplementation was January 2022 after she had been on them for an extended period of time. The pt notes she was pregnant in 1970 and had issues with anemia during this, but not needing transfusions either. The pt notes that the only medical issue she experienced in her life was hypertension. She denies ever having cancer or needing any surgeries in the past. The pt notes no medication allergies and is currently on ASA and HCTZ. The pt notes she takes a multivitamin daily. The pt is up to date with all age related cancer screenings.  The pt notes that she was experiencing sharp pain in her legs. The pt notes this has improved since increasing her physical activity and elevating her legs.  Lab results 08/29/2020 of CBC w/diff and CMP is as follows: all values are WNL except for RBC of 5.44, Hgb of 9.6, HCT of 32.0, MCV of 59, MCH of 17.6, MCHC of 30.0, RDW of 27.1, Plt of 147K.   08/29/2020 LDH of 200.  On review of systems, pt reports fatigue and denies bleeding issues, gall stones, change in curvature of spine, bone pains,  abdominal pain, leg swelling, and any other symptoms.  MEDICAL HISTORY:  Past Medical History:  Diagnosis Date  . Anemia   . Cancer ([place])   . Hypertension     SURGICAL HISTORY: No past surgical history on file.  SOCIAL HISTORY: Social History   Socioeconomic History  . Marital status: Widowed    Spouse name: Not on file  . Number of children: Not on file  . Years of education: Not on file  . Highest education level: Not on file  Occupational History  . Not on file  Tobacco Use  . Smoking status: Never Smoker  . Smokeless tobacco: Never Used  Substance and Sexual Activity  . Alcohol use: No  . Drug use: No  . Sexual activity: Not on file  Other Topics Concern  . Not on file  Social History Narrative  . Not on file   Social Determinants of Health   Financial Resource Strain: Not on file  Food Insecurity: Not on file  Transportation Needs: Not on file  Physical Activity: Not on file  Stress: Not on file  Social Connections: Not on file  Intimate Partner Violence: Not on file    FAMILY HISTORY: Family History  Problem Relation Age of Onset  . Diabetes Mother   . Hypertension Mother   . Breast cancer Neg Hx     ALLERGIES:  has No Known Allergies.  MEDICATIONS:  Current Outpatient Medications  Medication Sig Dispense Refill  . amLODipine (NORVASC) 5 MG tablet Take 1 tablet (5 mg total) by mouth at bedtime. 90 tablet 3  . aspirin EC 81 MG tablet Take 81 mg by mouth daily.    . fluticasone (FLONASE) 50 MCG/ACT nasal spray Place 1 spray into both nostrils daily. 15.8 mL 2  . HYDROcodone-acetaminophen (NORCO/VICODIN) 5-325 MG tablet Take 1 tablet by mouth every 4 (four) hours as needed. 6 tablet 0  . ibuprofen (ADVIL,MOTRIN) 200 MG tablet Take 200 mg by mouth every 6 (six) hours as needed.    Marland Kitchen lisinopril-hydrochlorothiazide (PRINZIDE,ZESTORETIC) 20-25 MG tablet Take 1 tablet by mouth daily. 30 tablet 6   No current facility-administered medications for this  visit.    REVIEW OF SYSTEMS:   10 Point review of Systems was done is negative except as noted above.  PHYSICAL EXAMINATION: ECOG PERFORMANCE STATUS: 1 - Symptomatic but completely ambulatory  . Vitals:   10/05/20 1324  BP: (!) 195/60  Pulse: 68  Resp: 18  Temp: 98.2 F (36.8 C)  SpO2: 98%   Filed Weights   10/05/20 1324  Weight: 248 lb (112.5 kg)   .Body mass index is 45.36 kg/m.  NAD. GENERAL:alert, in no acute distress and comfortable SKIN: no acute rashes, no significant lesions EYES: conjunctiva are pink and non-injected, sclera anicteric OROPHARYNX: MMM, no exudates, no oropharyngeal erythema or ulceration NECK: supple, no JVD LYMPH:  no palpable lymphadenopathy in the cervical, axillary or inguinal regions LUNGS: clear to auscultation b/l with normal respiratory effort HEART: regular rate & rhythm ABDOMEN:  normoactive bowel sounds , non tender, not distended. Extremity: no pedal edema PSYCH: alert & oriented x 3 with fluent speech NEURO: no focal motor/sensory deficits  LABORATORY DATA:  I have reviewed the data as listed  . CBC Latest Ref Rng & Units 10/05/2020 10/05/2020 08/29/2020  WBC 4.0 - 10.5 K/uL 4.3 - 4.8  Hemoglobin 12.0 - 15.0 g/dL 8.8(L) - 9.6(L)  Hematocrit 34.0 - 46.6 % 31.7(L) 28.5(L) 32.0(L)  Platelets 150 - 400 K/uL 137(L) - 147(L)    . CMP Latest Ref Rng & Units 10/05/2020 08/29/2020 05/25/2020  Glucose 70 - 99 mg/dL 85 90 -  BUN 8 - 23 mg/dL 18 17 -  Creatinine 0.44 - 1.00 mg/dL 0.78 0.80 -  Sodium 135 - 145 mmol/L 139 139 -  Potassium 3.5 - 5.1 mmol/L 3.8 3.8 -  Chloride 98 - 111 mmol/L 106 104 -  CO2 22 - 32 mmol/L 24 21 -  Calcium 8.9 - 10.3 mg/dL 8.4(L) 9.1 -  Total Protein 6.5 - 8.1 g/dL 7.3 7.2 6.8  Total Bilirubin 0.3 - 1.2 mg/dL 1.5(H) 1.2 1.3(H)  Alkaline Phos 38 - 126 U/L 44 46 35(L)  AST 15 - 41 U/L 15 15 13   ALT 0 - 44 U/L 11 10 11      RADIOGRAPHIC STUDIES: I have personally reviewed the radiological images as listed  and agreed with the findings in the report. No results found.  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 The most common cause of this is iron deficiency, yet the pt has elevated iron levels and elevated RBC.  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  -Discussed things that can worsen her anemia: infections, certain medications that cause oxidative stress, certain herbal supplements, psychological/physical stressors. -Advised pt that lack of alpha chains can make the red cells more delicate and lead to red cell breakage and gallstones.  -Advised pt that her children may be carriers of this disease and important to let them know for informational purposes. -Advised pt this is important to know if getting surgeries as it may take longer for her to recover than normal. -Discussed ineffective erythropoiesis causing iron overload. Hold iron supplementation.  -Start Vitamin B Complex daily. -Will get labs today. -Will see back in 2 weeks via phone.   FOLLOW UP: Labs today Phone visit w Dr Irene Limbo in 1-2 weeks   All of the patients questions were answered with apparent satisfaction. The patient knows to call the clinic with any problems, questions or concerns.  I spent 40 minutes counseling the patient face to face. The total time spent in the appointment was 45 minutes and more than 50% was on counseling and direct patient cares.    Sullivan Lone MD [place] AAHIVMS [facility] [employer] Hematology/Oncology Physician [facility]  (Office):       [phone] (Work cell):  [phone] (Fax):           [phone]  10/05/2020 1:52 PM  I, Reinaldo Raddle, am acting as scribe for Dr. Sullivan Lone, MD.   .I have  reviewed the above documentation for accuracy and completeness, and I agree with the above. Brunetta Genera MD

## 2020-10-05 ENCOUNTER — Inpatient Hospital Stay: Payer: Medicare Other

## 2020-10-05 ENCOUNTER — Inpatient Hospital Stay: Payer: Medicare Other | Attending: Hematology | Admitting: Hematology

## 2020-10-05 ENCOUNTER — Other Ambulatory Visit: Payer: Self-pay

## 2020-10-05 VITALS — BP 195/60 | HR 68 | Temp 98.2°F | Resp 18 | Ht 62.0 in | Wt 248.0 lb

## 2020-10-05 DIAGNOSIS — D568 Other thalassemias: Secondary | ICD-10-CM | POA: Insufficient documentation

## 2020-10-05 DIAGNOSIS — I10 Essential (primary) hypertension: Secondary | ICD-10-CM | POA: Insufficient documentation

## 2020-10-05 DIAGNOSIS — D582 Other hemoglobinopathies: Secondary | ICD-10-CM

## 2020-10-05 DIAGNOSIS — D509 Iron deficiency anemia, unspecified: Secondary | ICD-10-CM | POA: Insufficient documentation

## 2020-10-05 LAB — CBC WITH DIFFERENTIAL/PLATELET
Abs Immature Granulocytes: 0.01 10*3/uL (ref 0.00–0.07)
Basophils Absolute: 0 10*3/uL (ref 0.0–0.1)
Basophils Relative: 0 %
Eosinophils Absolute: 0.1 10*3/uL (ref 0.0–0.5)
Eosinophils Relative: 2 %
HCT: 28.5 % — ABNORMAL LOW (ref 36.0–46.0)
Hemoglobin: 8.8 g/dL — ABNORMAL LOW (ref 12.0–15.0)
Immature Granulocytes: 0 %
Lymphocytes Relative: 34 %
Lymphs Abs: 1.5 10*3/uL (ref 0.7–4.0)
MCH: 17.3 pg — ABNORMAL LOW (ref 26.0–34.0)
MCHC: 30.9 g/dL (ref 30.0–36.0)
MCV: 56 fL — ABNORMAL LOW (ref 80.0–100.0)
Monocytes Absolute: 0.3 10*3/uL (ref 0.1–1.0)
Monocytes Relative: 8 %
Neutro Abs: 2.4 10*3/uL (ref 1.7–7.7)
Neutrophils Relative %: 56 %
Platelets: 137 10*3/uL — ABNORMAL LOW (ref 150–400)
RBC: 5.09 MIL/uL (ref 3.87–5.11)
RDW: 26 % — ABNORMAL HIGH (ref 11.5–15.5)
WBC: 4.3 10*3/uL (ref 4.0–10.5)
nRBC: 0 % (ref 0.0–0.2)

## 2020-10-05 LAB — CMP (CANCER CENTER ONLY)
ALT: 11 U/L (ref 0–44)
AST: 15 U/L (ref 15–41)
Albumin: 4.1 g/dL (ref 3.5–5.0)
Alkaline Phosphatase: 44 U/L (ref 38–126)
Anion gap: 9 (ref 5–15)
BUN: 18 mg/dL (ref 8–23)
CO2: 24 mmol/L (ref 22–32)
Calcium: 8.4 mg/dL — ABNORMAL LOW (ref 8.9–10.3)
Chloride: 106 mmol/L (ref 98–111)
Creatinine: 0.78 mg/dL (ref 0.44–1.00)
GFR, Estimated: >60 mL/min (ref >60)
Glucose, Bld: 85 mg/dL (ref 70–99)
Potassium: 3.8 mmol/L (ref 3.5–5.1)
Sodium: 139 mmol/L (ref 135–145)
Total Bilirubin: 1.5 mg/dL — ABNORMAL HIGH (ref 0.3–1.2)
Total Protein: 7.3 g/dL (ref 6.5–8.1)

## 2020-10-05 LAB — VITAMIN B12: Vitamin B-12: 702 pg/mL (ref 180–914)

## 2020-10-05 LAB — LACTATE DEHYDROGENASE: LDH: 191 U/L (ref 98–192)

## 2020-10-06 LAB — FOLATE RBC
Folate, Hemolysate: 244 ng/mL
Folate, RBC: 770 ng/mL (ref >498)
Hematocrit: 31.7 % — ABNORMAL LOW (ref 34.0–46.6)

## 2020-10-11 ENCOUNTER — Inpatient Hospital Stay (HOSPITAL_COMMUNITY): Admission: RE | Admit: 2020-10-11 | Payer: Medicare Other | Source: Ambulatory Visit

## 2020-10-12 ENCOUNTER — Telehealth: Payer: Self-pay | Admitting: Family Medicine

## 2020-10-12 NOTE — Telephone Encounter (Signed)
Pt walked in to pick-up the form for the handicap sign.  Please call with status ph# [phone].

## 2020-10-13 LAB — ALPHA-THALASSEMIA GENOTYPR

## 2020-10-13 NOTE — Telephone Encounter (Signed)
Please let pt know she can pick up her paperwork at the front desk.

## 2020-10-18 ENCOUNTER — Ambulatory Visit (INDEPENDENT_AMBULATORY_CARE_PROVIDER_SITE_OTHER): Payer: Medicare Other | Admitting: Family Medicine

## 2020-10-18 ENCOUNTER — Encounter: Payer: Self-pay | Admitting: Family Medicine

## 2020-10-18 ENCOUNTER — Other Ambulatory Visit: Payer: Self-pay

## 2020-10-18 DIAGNOSIS — D560 Alpha thalassemia: Secondary | ICD-10-CM

## 2020-10-18 DIAGNOSIS — I10 Essential (primary) hypertension: Secondary | ICD-10-CM | POA: Diagnosis not present

## 2020-10-18 NOTE — Assessment & Plan Note (Signed)
Patient again with elevated systolic blood pressure, but diastolic is slightly better than usual.  Patient did not get echocardiogram and has not returned calls from the cardiologist.  Katie Andrews the patient their number to call them.  We will also route message to referral coordinator in effort to get cardiology department to reach out to her 1 more time

## 2020-10-18 NOTE — Assessment & Plan Note (Signed)
Patient went to the hematologist and have blood work done but does not plan on going back.  States she did not enjoy the experience and felt like they took too much blood from her.

## 2020-10-18 NOTE — Progress Notes (Signed)
    SUBJECTIVE:   CHIEF COMPLAINT / HPI:   Patient states that she went to the hematologist but "is not going back there" because they took "8 pints of blood".  She also has not been answering the calls from the cardiologist that did not go to the echocardiogram appointment because she thought this was the same doctor as the hematologist.  We discussed calling the cardiologist.  Patient has agreed to call the cardiologist today provided the number for them.  PERTINENT  PMH / PSH: Hypertension, hemoglobin H disease  OBJECTIVE:   BP (!) 168/72   Pulse 82   Wt 250 lb 12.8 oz (113.8 kg)   SpO2 98%   BMI 45.87 kg/m   General: Alert and oriented.  No acute distress. CV: Regular rate and rhythm, no murmurs. Pulmonary: Lungs clear auscultation bilaterally Psych: Patient appears unhappy today.  ASSESSMENT/PLAN:   HYPERTENSION, BENIGN ESSENTIAL Patient again with elevated systolic blood pressure, but diastolic is slightly better than usual.  Patient did not get echocardiogram and has not returned calls from the cardiologist.  Hanley Seamen the patient their number to call them.  We will also route message to referral coordinator in effort to get cardiology department to reach out to her 1 more time  Hemoglobin H disease [facility]) Patient went to the hematologist and have blood work done but does not plan on going back.  States she did not enjoy the experience and felt like they took too much blood from her.     Benay Pike, MD [place]

## 2020-10-18 NOTE — Patient Instructions (Addendum)
It was nice to see you today,  I have included the number for the cardiologist below.  Please call them.  I will also talk to the referral coordinator and ask them to continue 1 more call.  Please call them at [phone]  This person is different than the hematologist who you saw earlier.  This is to evaluate your blood pressure.  Please schedule an appointment with me for 6 to 8 weeks from now.  Have a great day,  Addison Naegeli, MD

## 2020-10-26 NOTE — Progress Notes (Incomplete)
HEMATOLOGY/ONCOLOGY CONSULTATION NOTE  Date of Service: 10/27/2020  Patient Care Team: Benay Pike, MD as PCP - General (Family Medicine)  CHIEF COMPLAINTS/PURPOSE OF CONSULTATION:  Hemoglobin H Disease  HISTORY OF PRESENTING ILLNESS:   Katie Andrews is a wonderful 70 y.o. female who has been referred to [place] by Dr. 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 The pt reports that he is doing well overall.  The pt reports that she has bene anemic for all of her life. The pt notes no recent changes and notes that nothing changed that triggered the referral. She is unsure of why she was referred just now. The pt notes that she recently switched to Dr. Jeannine Kitten as her PCP and that is most likely the referral cause. The pt notes that she has not needed blood transfusions in the past or IV iron, just OTC Iron pills. The pt notes the last time she was on iron supplementation was January 2022 after she had been on them for an extended period of time. The pt notes she was pregnant in 1970 and had issues with anemia during this, but not needing transfusions either. The pt notes that the only medical issue she experienced in her life was hypertension. She denies ever having cancer or needing any surgeries in the past. The pt notes no medication allergies and is currently on ASA and HCTZ. The pt notes she takes a multivitamin daily. The pt is up to date with all age related cancer screenings.  The pt notes that she was experiencing sharp pain in her legs. The pt notes this has improved since increasing her physical activity and elevating her legs.  Lab results 08/29/2020 of CBC w/diff and CMP is as follows: all values are WNL except for RBC of 5.44, Hgb of 9.6, HCT of 32.0, MCV of 59, MCH of 17.6, MCHC of 30.0, RDW of 27.1, Plt of 147K.   08/29/2020 LDH of 200.  On review of systems, pt reports fatigue and denies bleeding issues, gall stones, change in curvature of spine, bone pains,  abdominal pain, leg swelling, and any other symptoms.  INTERVAL HISTORY I connected with Katie Andrews on 10/27/2020 by telephone and verified that I am speaking with the correct person using two identifiers.   I discussed the limitations of evaluation and management by telemedicine. The patient expressed understanding and agreed to proceed.   Other persons participating in the visit and their role in the encounter:                                                         - Reinaldo Raddle, Medical Scribe     Patient's location: Home Provider's location: [place] [facility] [place] is a wonderful 70 y.o. 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 The patient's last visit with [place] was on 10/05/2020. The pt reports that she is doing well overall.  The pt reports ***  Lab results 10/05/2020 of CBC w/diff and CMP is as follows: all values are WNL except for Hgb of 8.8, HCT of 28.5, MCV of 56.0, MCH of 17.3, RDW of 26.0, Plt of 137K, Calcium of 8.4, Total Bilirubin of 1.5.  10/05/2020 LDH of 191.  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 10/05/2020 Vitamin B12 of 702. 10/05/2020 Folate RBC of 770.  On review of systems, pt reports *** and denies *** and any other symptoms.   MEDICAL HISTORY:  Past Medical History:  Diagnosis Date  . Anemia   . Cancer ([place])   . Hypertension     SURGICAL HISTORY: No past surgical history on file.  SOCIAL HISTORY: Social History   Socioeconomic History  . Marital status: Widowed    Spouse name: Not on file  . Number of children: Not on file  . Years of education: Not on file  . Highest education level: Not on file  Occupational History  . Not on file  Tobacco Use  . Smoking status: Never Smoker  . Smokeless tobacco: Never Used  Substance and Sexual Activity  . Alcohol use: No  . Drug use:  No  . Sexual activity: Not on file  Other Topics Concern  . Not on file  Social History Narrative  . Not on file   Social Determinants of Health   Financial Resource Strain: Not on file  Food Insecurity: Not on file  Transportation Needs: Not on file  Physical Activity: Not on file  Stress: Not on file  Social Connections: Not on file  Intimate Partner Violence: Not on file    FAMILY HISTORY: Family History  Problem Relation Age of Onset  . Diabetes Mother   . Hypertension Mother   . Breast cancer Neg Hx     ALLERGIES:  has No Known Allergies.  MEDICATIONS:  Current Outpatient Medications  Medication Sig Dispense Refill  . amLODipine (NORVASC) 5 MG tablet Take 1 tablet (5 mg total) by mouth at bedtime. 90 tablet 3  . aspirin EC 81 MG tablet Take 81 mg by mouth daily.    . fluticasone (FLONASE) 50 MCG/ACT nasal spray Place 1 spray into both nostrils daily. 15.8 mL 2  . HYDROcodone-acetaminophen (NORCO/VICODIN) 5-325 MG tablet Take 1 tablet by mouth every 4 (four) hours as needed. 6 tablet 0  . ibuprofen (ADVIL,MOTRIN) 200 MG tablet Take 200 mg by mouth every 6 (six) hours as needed.    Marland Kitchen lisinopril-hydrochlorothiazide (PRINZIDE,ZESTORETIC) 20-25 MG tablet Take 1 tablet by mouth daily. 30 tablet 6   No current facility-administered medications for this visit.    REVIEW OF SYSTEMS:   10 Point review of Systems was done is negative except as noted above.  PHYSICAL EXAMINATION: ECOG PERFORMANCE STATUS: 1 - Symptomatic but completely ambulatory  . There were no vitals filed for this visit. There were no vitals filed for this visit. .There is no height or weight on file to calculate BMI.  Telehealth Visit.  LABORATORY DATA:  I have reviewed the data as listed  . CBC Latest Ref Rng & Units 10/05/2020 10/05/2020 08/29/2020  WBC 4.0 - 10.5 K/uL 4.3 - 4.8  Hemoglobin 12.0 - 15.0 g/dL 8.8(L) - 9.6(L)  Hematocrit 34.0 - 46.6 % 31.7(L) 28.5(L) 32.0(L)  Platelets 150 - 400  K/uL 137(L) - 147(L)    . CMP Latest Ref Rng & Units 10/05/2020 08/29/2020 05/25/2020  Glucose 70 - 99 mg/dL 85 90 -  BUN 8 - 23 mg/dL 18 17 -  Creatinine 0.44 - 1.00 mg/dL 0.78 0.80 -  Sodium 135 - 145 mmol/L 139 139 -  Potassium 3.5 - 5.1 mmol/L 3.8 3.8 -  Chloride 98 - 111 mmol/L 106 104 -  CO2 22 - 32  mmol/L 24 21 -  Calcium 8.9 - 10.3 mg/dL 8.4(L) 9.1 -  Total Protein 6.5 - 8.1 g/dL 7.3 7.2 6.8  Total Bilirubin 0.3 - 1.2 mg/dL 1.5(H) 1.2 1.3(H)  Alkaline Phos 38 - 126 U/L 44 46 35(L)  AST 15 - 41 U/L 15 15 13   ALT 0 - 44 U/L 11 10 11      RADIOGRAPHIC STUDIES: I have personally reviewed the radiological images as listed and agreed with the findings in the report. No results found.  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 O16 and Folic Acid normal. 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   -Discussed ineffective erythropoiesis causing iron overload. Hold iron supplementation.  -Start Vitamin B Complex daily. -Will see back in ***   FOLLOW UP: ***   All of the patients questions were answered with apparent satisfaction. The patient knows to call the clinic with any problems, questions or concerns.  The total time spent in the appointment was *** minutes and more than 50% was on counseling and direct patient cares.     Sullivan Lone MD [place] AAHIVMS [facility] Hematology/Oncology Physician [facility]  (Office):       [phone] (Work cell):  [phone] (Fax):           [phone]  10/27/2020 10:55 AM  I, Reinaldo Raddle, am acting as scribe for Dr. Sullivan Lone, MD.

## 2020-10-27 ENCOUNTER — Inpatient Hospital Stay: Payer: Medicare Other | Admitting: Hematology

## 2020-10-31 NOTE — Progress Notes (Incomplete)
HEMATOLOGY/ONCOLOGY CONSULTATION NOTE  Date of Service: 11/01/2020  Patient Care Team: Benay Pike, MD as PCP - General (Family Medicine)  CHIEF COMPLAINTS/PURPOSE OF CONSULTATION:  Hemoglobin H Disease  HISTORY OF PRESENTING ILLNESS:   Katie Andrews is a wonderful 70 y.o. female who has been referred to [place] by Dr. 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 The pt reports that he is doing well overall.  The pt reports that she has bene anemic for all of her life. The pt notes no recent changes and notes that nothing changed that triggered the referral. She is unsure of why she was referred just now. The pt notes that she recently switched to Dr. Jeannine Kitten as her PCP and that is most likely the referral cause. The pt notes that she has not needed blood transfusions in the past or IV iron, just OTC Iron pills. The pt notes the last time she was on iron supplementation was January 2022 after she had been on them for an extended period of time. The pt notes she was pregnant in 1970 and had issues with anemia during this, but not needing transfusions either. The pt notes that the only medical issue she experienced in her life was hypertension. She denies ever having cancer or needing any surgeries in the past. The pt notes no medication allergies and is currently on ASA and HCTZ. The pt notes she takes a multivitamin daily. The pt is up to date with all age related cancer screenings.  The pt notes that she was experiencing sharp pain in her legs. The pt notes this has improved since increasing her physical activity and elevating her legs.  Lab results 08/29/2020 of CBC w/diff and CMP is as follows: all values are WNL except for RBC of 5.44, Hgb of 9.6, HCT of 32.0, MCV of 59, MCH of 17.6, MCHC of 30.0, RDW of 27.1, Plt of 147K.   08/29/2020 LDH of 200.  On review of systems, pt reports fatigue and denies bleeding issues, gall stones, change in curvature of spine, bone pains,  abdominal pain, leg swelling, and any other symptoms.  INTERVAL HISTORY I connected with Pecolia Marando Leising on 11/01/2020 by telephone and verified that I am speaking with the correct person using two identifiers.   I discussed the limitations of evaluation and management by telemedicine. The patient expressed understanding and agreed to proceed.   Other persons participating in the visit and their role in the encounter:                                                         - Reinaldo Raddle, Medical Scribe     Patient's location: Home Provider's location: [place] [facility] [place] is a wonderful 70 y.o. 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 The patient's last visit with [place] was on 10/05/2020. The pt reports that she is doing well overall.  The pt reports ***  Lab results 10/05/2020 of CBC w/diff and CMP is as follows: all values are WNL except for Hgb of 8.8, HCT of 28.5, MCV of 56.0, MCH of 17.3, RDW of 26.0, Plt of 137K, Calcium of 8.4, Total Bilirubin of 1.5. 10/05/2020 Folate RBC of 770. 10/05/2020 LDH of 191.  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 10/05/2020 Vitamin B-12 of 702.  On review of systems, pt reports *** and denies *** and any other symptoms.   MEDICAL HISTORY:  Past Medical History:  Diagnosis Date  . Anemia   . Cancer ([place])   . Hypertension     SURGICAL HISTORY: No past surgical history on file.  SOCIAL HISTORY: Social History   Socioeconomic History  . Marital status: Widowed    Spouse name: Not on file  . Number of children: Not on file  . Years of education: Not on file  . Highest education level: Not on file  Occupational History  . Not on file  Tobacco Use  . Smoking status: Never Smoker  . Smokeless tobacco: Never Used  Substance and Sexual Activity  . Alcohol use: No  . Drug use: No  . Sexual activity: Not on file  Other Topics Concern  . Not on file   Social History Narrative  . Not on file   Social Determinants of Health   Financial Resource Strain: Not on file  Food Insecurity: Not on file  Transportation Needs: Not on file  Physical Activity: Not on file  Stress: Not on file  Social Connections: Not on file  Intimate Partner Violence: Not on file    FAMILY HISTORY: Family History  Problem Relation Age of Onset  . Diabetes Mother   . Hypertension Mother   . Breast cancer Neg Hx     ALLERGIES:  has No Known Allergies.  MEDICATIONS:  Current Outpatient Medications  Medication Sig Dispense Refill  . amLODipine (NORVASC) 5 MG tablet Take 1 tablet (5 mg total) by mouth at bedtime. 90 tablet 3  . aspirin EC 81 MG tablet Take 81 mg by mouth daily.    . fluticasone (FLONASE) 50 MCG/ACT nasal spray Place 1 spray into both nostrils daily. 15.8 mL 2  . HYDROcodone-acetaminophen (NORCO/VICODIN) 5-325 MG tablet Take 1 tablet by mouth every 4 (four) hours as needed. 6 tablet 0  . ibuprofen (ADVIL,MOTRIN) 200 MG tablet Take 200 mg by mouth every 6 (six) hours as needed.    Marland Kitchen lisinopril-hydrochlorothiazide (PRINZIDE,ZESTORETIC) 20-25 MG tablet Take 1 tablet by mouth daily. 30 tablet 6   No current facility-administered medications for this visit.    REVIEW OF SYSTEMS:   10 Point review of Systems was done is negative except as noted above.  PHYSICAL EXAMINATION: ECOG PERFORMANCE STATUS: 1 - Symptomatic but completely ambulatory  . There were no vitals filed for this visit. There were no vitals filed for this visit. .There is no height or weight on file to calculate BMI.  Telehealth Visit.  LABORATORY DATA:  I have reviewed the data as listed  . CBC Latest Ref Rng & Units 10/05/2020 10/05/2020 08/29/2020  WBC 4.0 - 10.5 K/uL 4.3 - 4.8  Hemoglobin 12.0 - 15.0 g/dL 8.8(L) - 9.6(L)  Hematocrit 34.0 - 46.6 % 31.7(L) 28.5(L) 32.0(L)  Platelets 150 - 400 K/uL 137(L) - 147(L)    . CMP Latest Ref Rng & Units 10/05/2020 08/29/2020  05/25/2020  Glucose 70 - 99 mg/dL 85 90 -  BUN 8 - 23 mg/dL 18 17 -  Creatinine 0.44 - 1.00 mg/dL 0.78 0.80 -  Sodium 135 - 145 mmol/L 139 139 -  Potassium 3.5 - 5.1 mmol/L 3.8 3.8 -  Chloride 98 - 111 mmol/L 106 104 -  CO2 22 - 32 mmol/L 24 21 -  Calcium 8.9 - 10.3 mg/dL 8.4(L) 9.1 -  Total Protein 6.5 - 8.1 g/dL 7.3 7.2 6.8  Total Bilirubin 0.3 - 1.2 mg/dL 1.5(H) 1.2 1.3(H)  Alkaline Phos 38 - 126 U/L 44 46 35(L)  AST 15 - 41 U/L 15 15 13   ALT 0 - 44 U/L 11 10 11      RADIOGRAPHIC STUDIES: I have personally reviewed the radiological images as listed and agreed with the findings in the report. No results found.  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 The patient knows to call the clinic with any problems, questions or concerns.  The total time spent in the appointment was *** minutes and more than 50% was on counseling and direct patient cares.     Sullivan Lone MD [place] AAHIVMS [facility] Hematology/Oncology Physician [facility]  (Office):       [phone] (Work cell):  [phone] (Fax):           [phone]  11/01/2020 2:21 PM  I, Reinaldo Raddle, am acting as scribe for Dr. Sullivan Lone, MD.

## 2020-11-01 ENCOUNTER — Inpatient Hospital Stay: Payer: Medicare Other | Attending: Hematology | Admitting: Hematology

## 2020-11-02 NOTE — Progress Notes (Signed)
This encounter was created in error - please disregard.

## 2020-11-07 NOTE — Progress Notes (Signed)
This encounter was created in error - please disregard.

## 2020-11-30 ENCOUNTER — Other Ambulatory Visit: Payer: Self-pay | Admitting: Family Medicine

## 2020-11-30 DIAGNOSIS — Z1231 Encounter for screening mammogram for malignant neoplasm of breast: Secondary | ICD-10-CM

## 2021-01-25 ENCOUNTER — Ambulatory Visit
Admission: RE | Admit: 2021-01-25 | Discharge: 2021-01-25 | Disposition: A | Discharge status: Home / Self Care | Payer: Medicare Other | Source: Ambulatory Visit | Attending: Internal Medicine | Admitting: Internal Medicine

## 2021-01-25 ENCOUNTER — Other Ambulatory Visit: Payer: Self-pay

## 2021-01-25 DIAGNOSIS — Z1231 Encounter for screening mammogram for malignant neoplasm of breast: Secondary | ICD-10-CM

## 2021-07-16 ENCOUNTER — Encounter (HOSPITAL_COMMUNITY): Payer: Self-pay

## 2021-07-16 ENCOUNTER — Emergency Department (HOSPITAL_COMMUNITY): Payer: Medicare Other

## 2021-07-16 ENCOUNTER — Emergency Department (HOSPITAL_COMMUNITY)
Admission: EM | Admit: 2021-07-16 | Discharge: 2021-07-16 | Disposition: A | Discharge status: Home / Self Care | Payer: Medicare Other | Attending: Emergency Medicine | Admitting: Emergency Medicine

## 2021-07-16 ENCOUNTER — Other Ambulatory Visit: Payer: Self-pay

## 2021-07-16 DIAGNOSIS — Z7982 Long term (current) use of aspirin: Secondary | ICD-10-CM | POA: Insufficient documentation

## 2021-07-16 DIAGNOSIS — Z7951 Long term (current) use of inhaled steroids: Secondary | ICD-10-CM | POA: Insufficient documentation

## 2021-07-16 DIAGNOSIS — D649 Anemia, unspecified: Secondary | ICD-10-CM | POA: Insufficient documentation

## 2021-07-16 DIAGNOSIS — Z79899 Other long term (current) drug therapy: Secondary | ICD-10-CM | POA: Diagnosis not present

## 2021-07-16 DIAGNOSIS — I10 Essential (primary) hypertension: Secondary | ICD-10-CM | POA: Insufficient documentation

## 2021-07-16 DIAGNOSIS — U071 COVID-19: Secondary | ICD-10-CM | POA: Diagnosis not present

## 2021-07-16 LAB — CBC WITH DIFFERENTIAL/PLATELET
Abs Immature Granulocytes: 0.03 10*3/uL (ref 0.00–0.07)
Basophils Absolute: 0 10*3/uL (ref 0.0–0.1)
Basophils Relative: 0 %
Eosinophils Absolute: 0 10*3/uL (ref 0.0–0.5)
Eosinophils Relative: 0 %
HCT: 29.2 % — ABNORMAL LOW (ref 36.0–46.0)
Hemoglobin: 8.9 g/dL — ABNORMAL LOW (ref 12.0–15.0)
Immature Granulocytes: 1 %
Lymphocytes Relative: 11 %
Lymphs Abs: 0.5 10*3/uL — ABNORMAL LOW (ref 0.7–4.0)
MCH: 17.3 pg — ABNORMAL LOW (ref 26.0–34.0)
MCHC: 30.5 g/dL (ref 30.0–36.0)
MCV: 56.7 fL — ABNORMAL LOW (ref 80.0–100.0)
Monocytes Absolute: 0.3 10*3/uL (ref 0.1–1.0)
Monocytes Relative: 6 %
Neutro Abs: 4.2 10*3/uL (ref 1.7–7.7)
Neutrophils Relative %: 82 %
Platelets: 143 10*3/uL — ABNORMAL LOW (ref 150–400)
RBC: 5.15 MIL/uL — ABNORMAL HIGH (ref 3.87–5.11)
RDW: 27.9 % — ABNORMAL HIGH (ref 11.5–15.5)
WBC: 5.1 10*3/uL (ref 4.0–10.5)
nRBC: 0 % (ref 0.0–0.2)

## 2021-07-16 LAB — BASIC METABOLIC PANEL
Anion gap: 6 (ref 5–15)
BUN: 8 mg/dL (ref 8–23)
CO2: 24 mmol/L (ref 22–32)
Calcium: 8.3 mg/dL — ABNORMAL LOW (ref 8.9–10.3)
Chloride: 103 mmol/L (ref 98–111)
Creatinine, Ser: 0.55 mg/dL (ref 0.44–1.00)
GFR, Estimated: >60 mL/min (ref >60)
Glucose, Bld: 109 mg/dL — ABNORMAL HIGH (ref 70–99)
Potassium: 3.9 mmol/L (ref 3.5–5.1)
Sodium: 133 mmol/L — ABNORMAL LOW (ref 135–145)

## 2021-07-16 LAB — URINALYSIS, ROUTINE W REFLEX MICROSCOPIC
Bilirubin Urine: NEGATIVE
Glucose, UA: NEGATIVE mg/dL
Hgb urine dipstick: NEGATIVE
Ketones, ur: NEGATIVE mg/dL
Leukocytes,Ua: NEGATIVE
Nitrite: NEGATIVE
Protein, ur: NEGATIVE mg/dL
Specific Gravity, Urine: 1.013 (ref 1.005–1.030)
pH: 7 (ref 5.0–8.0)

## 2021-07-16 LAB — RESP PANEL BY RT-PCR (FLU A&B, COVID) ARPGX2
Influenza A by PCR: NEGATIVE
Influenza B by PCR: NEGATIVE
SARS Coronavirus 2 by RT PCR: POSITIVE — AB

## 2021-07-16 MED ORDER — NIRMATRELVIR/RITONAVIR (PAXLOVID)TABLET: 3.0000 | ORAL_TABLET | Freq: Two times a day (BID) | ORAL | 0 refills | Status: AC

## 2021-07-16 MED ORDER — AMLODIPINE BESYLATE 5 MG PO TABS: 5.0000 mg | ORAL_TABLET | Freq: Every day | ORAL | 0 refills | Status: DC

## 2021-07-16 MED ORDER — AMLODIPINE BESYLATE 5 MG PO TABS
5.0000 mg | ORAL_TABLET | Freq: Once | ORAL | Status: AC
  Administered 2021-07-16: 5 mg via ORAL
  Filled 2021-07-16: qty 1

## 2021-07-16 MED ORDER — HYDRALAZINE HCL 20 MG/ML IJ SOLN
10.0000 mg | Freq: Once | INTRAMUSCULAR | Status: AC
  Administered 2021-07-16: 10 mg via INTRAVENOUS
  Filled 2021-07-16: qty 1

## 2021-07-16 MED ORDER — LISINOPRIL 10 MG PO TABS
40.0000 mg | ORAL_TABLET | Freq: Once | ORAL | Status: AC
  Administered 2021-07-16: 40 mg via ORAL
  Filled 2021-07-16: qty 4

## 2021-07-16 MED ORDER — ACETAMINOPHEN 500 MG PO TABS
1000.0000 mg | ORAL_TABLET | Freq: Once | ORAL | Status: AC
  Administered 2021-07-16: 1000 mg via ORAL
  Filled 2021-07-16: qty 2

## 2021-07-16 NOTE — ED Triage Notes (Signed)
Patient said she checked her BP yesterday and it was 176/67. She said she could not sleep.

## 2021-07-16 NOTE — ED Provider Notes (Signed)
[place] DEPT Provider Note   CSN: [id number] Arrival date & time: 07/16/21  2993     History  Chief Complaint  Patient presents with   Hypertension    Katie Andrews is a 71 y.o. female.  Pt is a 71 yo bf with a hx of htn and hemoglobin H disease.  She checked her bp yesterday and it was elevated.  She had a hard time sleeping and was worried, so she came in.  She only has a rx bottle for lisinopril 40 mg although her chart said she is supposed to be on amlodipine and zestoretic.  She is unsure when her meds were changed, why, and by whom.  She has been taking her meds, but has not taken any today.  She has a fever, but was unaware she had a fever.  She has not taken anything for fever.  She has been urinating a lot, but denies pain.  She does have some sinus congestion.  No cp, cough, sob.      Home Medications Prior to Admission medications   Medication Sig Start Date End Date Taking? Authorizing Provider  nirmatrelvir/ritonavir EUA (PAXLOVID) 20 x 150 MG & 10 x 100MG  TABS Take 3 tablets by mouth 2 (two) times daily for 5 days. Patient GFR is >60. Take nirmatrelvir (150 mg) two tablets twice daily for 5 days and ritonavir (100 mg) one tablet twice daily for 5 days. 07/16/21 07/21/21 Yes Isla Pence, MD  amLODipine (NORVASC) 5 MG tablet Take 1 tablet (5 mg total) by mouth at bedtime. 07/16/21   Isla Pence, MD  aspirin EC 81 MG tablet Take 81 mg by mouth daily.    [provider]  fluticasone (FLONASE) 50 MCG/ACT nasal spray Place 1 spray into both nostrils daily. 08/01/20   Benay Pike, MD  HYDROcodone-acetaminophen (NORCO/VICODIN) 5-325 MG tablet Take 1 tablet by mouth every 4 (four) hours as needed. 06/24/17   Langston Masker B, PA-C  ibuprofen (ADVIL,MOTRIN) 200 MG tablet Take 200 mg by mouth every 6 (six) hours as needed.    [provider]  lisinopril-hydrochlorothiazide (PRINZIDE,ZESTORETIC) 20-25 MG tablet Take 1  tablet by mouth daily. 02/29/16   Charlott Rakes, MD      Allergies    Patient has no known allergies.    Review of Systems   Review of Systems  HENT:  Positive for congestion.   All other systems reviewed and are negative.  Physical Exam Updated Vital Signs BP (!) 190/63    Pulse 83    Temp 99.5 F (37.5 C) (Oral)    Resp (!) 21    Ht 5\' 2"  (1.575 m)    Wt 113 kg    SpO2 97%    BMI 45.56 kg/m  Physical Exam Vitals and nursing note reviewed.  Constitutional:      Appearance: Normal appearance.  HENT:     Head: Normocephalic and atraumatic.     Right Ear: External ear normal.     Left Ear: External ear normal.     Nose: Nose normal.     Mouth/Throat:     Mouth: Mucous membranes are moist.     Pharynx: Oropharynx is clear.  Eyes:     Extraocular Movements: Extraocular movements intact.     Conjunctiva/sclera: Conjunctivae normal.     Pupils: Pupils are equal, round, and reactive to light.  Cardiovascular:     Rate and Rhythm: Normal rate and regular rhythm.  Pulses: Normal pulses.     Heart sounds: Normal heart sounds.  Pulmonary:     Effort: Pulmonary effort is normal.     Breath sounds: Normal breath sounds.  Abdominal:     General: Abdomen is flat. Bowel sounds are normal.     Palpations: Abdomen is soft.  Musculoskeletal:        General: Normal range of motion.     Cervical back: Normal range of motion and neck supple.  Skin:    General: Skin is warm.     Capillary Refill: Capillary refill takes less than 2 seconds.  Neurological:     General: No focal deficit present.     Mental Status: She is alert and oriented to person, place, and time.  Psychiatric:        Mood and Affect: Mood normal.        Behavior: Behavior normal.        Thought Content: Thought content normal.        Judgment: Judgment normal.    ED Results / Procedures / Treatments   Labs (all labs ordered are listed, but only abnormal results are displayed) Labs Reviewed  RESP PANEL BY  RT-PCR (FLU A&B, COVID) ARPGX2 - Abnormal; Notable for the following components:      Result Value   SARS Coronavirus 2 by RT PCR POSITIVE (*)    All other components within normal limits  BASIC METABOLIC PANEL - Abnormal; Notable for the following components:   Sodium 133 (*)    Glucose, Bld 109 (*)    Calcium 8.3 (*)    All other components within normal limits  CBC WITH DIFFERENTIAL/PLATELET - Abnormal; Notable for the following components:   RBC 5.15 (*)    Hemoglobin 8.9 (*)    HCT 29.2 (*)    MCV 56.7 (*)    MCH 17.3 (*)    RDW 27.9 (*)    Platelets 143 (*)    Lymphs Abs 0.5 (*)    All other components within normal limits  URINALYSIS, ROUTINE W REFLEX MICROSCOPIC - Abnormal; Notable for the following components:   APPearance HAZY (*)    All other components within normal limits  PATHOLOGIST SMEAR REVIEW    EKG EKG Interpretation  Date/Time:  Sunday July 16 2021 06:54:56 EST Ventricular Rate:  82 PR Interval:  162 QRS Duration: 118 QT Interval:  373 QTC Calculation: 436 R Axis:   -45 Text Interpretation: Sinus rhythm LVH with IVCD, LAD and secondary repol abnrm No old tracing to compare Confirmed by Isla Pence [phone]) on 07/16/2021 7:14:15 AM  Radiology DG Chest Portable 1 View  Result Date: 07/16/2021 CLINICAL DATA:  71 year old female with history of fever. EXAM: PORTABLE CHEST 1 VIEW COMPARISON:  No priors. FINDINGS: Lung volumes are normal. No consolidative airspace disease. No pleural effusions. No pneumothorax. No evidence of pulmonary edema. Heart size is moderately enlarged. Upper mediastinal contours are within normal limits. IMPRESSION: 1. Cardiomegaly without other radiographic evidence of acute cardiopulmonary disease. Electronically Signed   By: Vinnie Langton M.D.   On: 07/16/2021 07:42    Procedures Procedures    Medications Ordered in ED Medications  acetaminophen (TYLENOL) tablet 1,000 mg (1,000 mg Oral Given 07/16/21 0714)  amLODipine  (NORVASC) tablet 5 mg (5 mg Oral Given 07/16/21 0714)  lisinopril (ZESTRIL) tablet 40 mg (40 mg Oral Given 07/16/21 0721)  hydrALAZINE (APRESOLINE) injection 10 mg (10 mg Intravenous Given 07/16/21 0930)    ED Course/ Medical Decision Making/  A&P                           Medical Decision Making  Pt is + for Covid.  She has been vaccinated.  This is the source of her fever.  She has a negative CXR and O2 sat is normal.  She has no contraindications to Paxlovid and is interested in taking this medication.  BP remains high despite lisinopril 40 mg + amlodipine 5 mg.  Pt given a dose of hydralazine b/c hr was 59.  BP is under 200 now.  She is stable for d/c.  Pt will be put back on amlodipine.  She is to f/u with her pcp in the [facility].  Pt knows to return if worse.  F/u with pcp.        Final Clinical Impression(s) / ED Diagnoses Final diagnoses:  Hypertension, unspecified type  COVID-19  Chronic anemia    Rx / DC Orders ED Discharge Orders          Ordered    nirmatrelvir/ritonavir EUA (PAXLOVID) 20 x 150 MG & 10 x 100MG  TABS  2 times daily        07/16/21 0935    amLODipine (NORVASC) 5 MG tablet  Daily at bedtime        07/16/21 0935              Isla Pence, MD 07/16/21 1011

## 2021-07-17 LAB — PATHOLOGIST SMEAR REVIEW

## 2021-12-21 ENCOUNTER — Other Ambulatory Visit: Payer: Self-pay | Admitting: Internal Medicine

## 2021-12-21 DIAGNOSIS — Z1231 Encounter for screening mammogram for malignant neoplasm of breast: Secondary | ICD-10-CM

## 2021-12-22 ENCOUNTER — Ambulatory Visit
Admission: RE | Admit: 2021-12-22 | Discharge: 2021-12-22 | Disposition: A | Discharge status: Home / Self Care | Payer: Medicare Other | Source: Ambulatory Visit | Attending: Internal Medicine | Admitting: Internal Medicine

## 2021-12-22 DIAGNOSIS — Z1231 Encounter for screening mammogram for malignant neoplasm of breast: Secondary | ICD-10-CM

## 2022-01-26 ENCOUNTER — Ambulatory Visit
Admission: RE | Admit: 2022-01-26 | Discharge: 2022-01-26 | Disposition: A | Discharge status: Home / Self Care | Payer: Medicare Other | Source: Ambulatory Visit | Attending: Internal Medicine | Admitting: Internal Medicine

## 2022-06-01 ENCOUNTER — Other Ambulatory Visit: Payer: Self-pay

## 2022-06-01 DIAGNOSIS — D582 Other hemoglobinopathies: Secondary | ICD-10-CM

## 2022-06-04 ENCOUNTER — Inpatient Hospital Stay: Payer: Medicare Other | Attending: Hematology

## 2022-06-04 ENCOUNTER — Inpatient Hospital Stay (HOSPITAL_BASED_OUTPATIENT_CLINIC_OR_DEPARTMENT_OTHER): Payer: Medicare Other | Admitting: Hematology

## 2022-06-04 ENCOUNTER — Other Ambulatory Visit: Payer: Self-pay | Admitting: Hematology

## 2022-06-04 ENCOUNTER — Inpatient Hospital Stay: Payer: Medicare Other

## 2022-06-04 ENCOUNTER — Other Ambulatory Visit: Payer: Self-pay

## 2022-06-04 ENCOUNTER — Encounter: Payer: Self-pay | Admitting: Hematology

## 2022-06-04 DIAGNOSIS — Z79899 Other long term (current) drug therapy: Secondary | ICD-10-CM | POA: Diagnosis not present

## 2022-06-04 DIAGNOSIS — I10 Essential (primary) hypertension: Secondary | ICD-10-CM | POA: Diagnosis not present

## 2022-06-04 DIAGNOSIS — D509 Iron deficiency anemia, unspecified: Secondary | ICD-10-CM | POA: Diagnosis present

## 2022-06-04 DIAGNOSIS — D582 Other hemoglobinopathies: Secondary | ICD-10-CM

## 2022-06-04 LAB — LACTATE DEHYDROGENASE: LDH: 175 U/L (ref 98–192)

## 2022-06-04 LAB — CMP (CANCER CENTER ONLY)
ALT: 8 U/L (ref 0–44)
AST: 12 U/L — ABNORMAL LOW (ref 15–41)
Albumin: 4.4 g/dL (ref 3.5–5.0)
Alkaline Phosphatase: 33 U/L — ABNORMAL LOW (ref 38–126)
Anion gap: 5 (ref 5–15)
BUN: 17 mg/dL (ref 8–23)
CO2: 26 mmol/L (ref 22–32)
Calcium: 9.3 mg/dL (ref 8.9–10.3)
Chloride: 108 mmol/L (ref 98–111)
Creatinine: 0.87 mg/dL (ref 0.44–1.00)
GFR, Estimated: >60 mL/min (ref >60)
Glucose, Bld: 99 mg/dL (ref 70–99)
Potassium: 3.9 mmol/L (ref 3.5–5.1)
Sodium: 139 mmol/L (ref 135–145)
Total Bilirubin: 1.2 mg/dL (ref 0.3–1.2)
Total Protein: 7.5 g/dL (ref 6.5–8.1)

## 2022-06-04 LAB — VITAMIN B12: Vitamin B-12: 567 pg/mL (ref 180–914)

## 2022-06-04 MED ORDER — AMLODIPINE BESYLATE 5 MG PO TABS: 5.0000 mg | ORAL_TABLET | Freq: Every day | ORAL | 0 refills | Status: DC

## 2022-06-04 MED ORDER — LISINOPRIL-HYDROCHLOROTHIAZIDE 20-25 MG PO TABS: 1.0000 | ORAL_TABLET | Freq: Every day | ORAL | 0 refills | Status: DC

## 2022-06-04 NOTE — Progress Notes (Signed)
HEMATOLOGY/ONCOLOGY CONSULTATION NOTE  Date of Service: 06/04/2022  Patient Care Team: Pcp, No as PCP - General  CHIEF COMPLAINTS/PURPOSE OF CONSULTATION:  Hemoglobin H Disease  HISTORY OF PRESENTING ILLNESS:   Katie Andrews is a wonderful 71 y.o. female who has been referred to [place] by Dr. 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 The pt reports that he is doing well overall.  The pt reports that she has bene anemic for all of her life. The pt notes no recent changes and notes that nothing changed that triggered the referral. She is unsure of why she was referred just now. The pt notes that she recently switched to Dr. Jeannine Kitten as her PCP and that is most likely the referral cause. The pt notes that she has not needed blood transfusions in the past or IV iron, just OTC Iron pills. The pt notes the last time she was on iron supplementation was January 2022 after she had been on them for Andrews extended period of time. The pt notes she was pregnant in 1970 and had issues with anemia during this, but not needing transfusions either. The pt notes that the only medical issue she experienced in her life was hypertension. She denies ever having cancer or needing any surgeries in the past. The pt notes no medication allergies and is currently on ASA and HCTZ. The pt notes she takes a multivitamin daily. The pt is up to date with all age related cancer screenings.  The pt notes that she was experiencing sharp pain in her legs. The pt notes this has improved since increasing her physical activity and elevating her legs.  Lab results 08/29/2020 of CBC w/diff and CMP is as follows: all values are WNL except for RBC of 5.44, Hgb of 9.6, HCT of 32.0, MCV of 59, MCH of 17.6, MCHC of 30.0, RDW of 27.1, Plt of 147K.   08/29/2020 LDH of 200.  On review of systems, pt reports fatigue and denies bleeding issues, gall stones, change in curvature of spine, bone pains, abdominal pain, leg swelling, and  any other symptoms.  Interval History:  Katie Andrews is a wonderful 71 y.o. 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 She was initially seen by me on 10/05/2020 and was doing well overall.   Patient reports she has been doing well without any new medical concerns since our last visit. Her blood pressure is elevated during today's visit at 238/72. She has been prescribed amlodipine 5 mg and lisinopril-hydrochlorothiazide 20-25 mg. However, she reports she is only taking amlodipine and lisinopril-hydrochlorothiazide since she cannot find her medication. She last saw her PCP around a year or two years ago.  She complains of mild tiredness/low energy since our last visit. She denies fever, chills, back pain, chest pain, abdominal pain, abnormal bowl moments, new infection issues, weight loss, or leg swelling. She reports she is eating well and staying hydrated.   Patient has received the influenza vaccine, COVID-19 Booster, and RSV vaccine.   MEDICAL HISTORY:  Past Medical History:  Diagnosis Date   Anemia    Hypertension     SURGICAL HISTORY: No past surgical history on file.  SOCIAL HISTORY: Social History   Socioeconomic History   Marital status: Widowed    Spouse name: Not on file   Number of children: Not on file   Years of education: Not on file   Highest education level: Not on file  Occupational  History   Not on file  Tobacco Use   Smoking status: Never   Smokeless tobacco: Never  Substance and Sexual Activity   Alcohol use: No   Drug use: No   Sexual activity: Not on file  Other Topics Concern   Not on file  Social History Narrative   Not on file   Social Determinants of Health   Financial Resource Strain: Not on file  Food Insecurity: Not on file  Transportation Needs: Not on file  Physical Activity: Not on file  Stress: Not on file  Social Connections: Not on file  Intimate Partner Violence: Not on file    FAMILY  HISTORY: Family History  Problem Relation Age of Onset   Diabetes Mother    Hypertension Mother    Breast cancer Neg Hx     ALLERGIES:  has No Known Allergies.  MEDICATIONS:  Current Outpatient Medications  Medication Sig Dispense Refill   amLODipine (NORVASC) 5 MG tablet Take 1 tablet (5 mg total) by mouth at bedtime. 90 tablet 0   aspirin EC 81 MG tablet Take 81 mg by mouth daily.     fluticasone (FLONASE) 50 MCG/ACT nasal spray Place 1 spray into both nostrils daily. 15.8 mL 2   lisinopril-hydrochlorothiazide (PRINZIDE,ZESTORETIC) 20-25 MG tablet Take 1 tablet by mouth daily. 30 tablet 6   No current facility-administered medications for this visit.    REVIEW OF SYSTEMS:   10 Point review of Systems was done is negative except as noted above.  PHYSICAL EXAMINATION: ECOG PERFORMANCE STATUS: 1 - Symptomatic but completely ambulatory  . Vitals:   06/04/22 1228  BP: (!) 238/72  Pulse: 77  Resp: 17  Temp: 98.1 F (36.7 C)  SpO2: 97%    Filed Weights   06/04/22 1228  Weight: 250 lb 9.6 oz (113.7 kg)    .Body mass index is 45.84 kg/m.  NAD. GENERAL:alert, in no acute distress and comfortable SKIN: no acute rashes, no significant lesions EYES: conjunctiva are pink and non-injected, sclera anicteric OROPHARYNX: MMM, no exudates, no oropharyngeal erythema or ulceration NECK: supple, no JVD LYMPH:  no palpable lymphadenopathy in the cervical, axillary or inguinal regions LUNGS: clear to auscultation b/l with normal respiratory effort HEART: regular rate & rhythm ABDOMEN:  normoactive bowel sounds , non tender, not distended. Extremity: no pedal edema PSYCH: alert & oriented x 3 with fluent speech NEURO: no focal motor/sensory deficits  LABORATORY DATA:  I have reviewed the data as listed  .    Latest Ref Rng & Units 06/04/2022   12:03 PM 07/16/2021    7:05 AM 10/05/2020    1:59 PM  CBC  WBC 4.0 - 10.5 K/uL  5.1    Hemoglobin 12.0 - 15.0 g/dL  8.9     Hematocrit 34.0 - 46.6 % 32.3  29.2  31.7   Platelets 150 - 400 K/uL  143      .    Latest Ref Rng & Units 06/04/2022   12:03 PM 07/16/2021    7:05 AM 10/05/2020    1:58 PM  CMP  Glucose 70 - 99 mg/dL 99  109  85   BUN 8 - 23 mg/dL '17  8  18   '$ Creatinine 0.44 - 1.00 mg/dL 0.87  0.55  0.78   Sodium 135 - 145 mmol/L 139  133  139   Potassium 3.5 - 5.1 mmol/L 3.9  3.9  3.8   Chloride 98 - 111 mmol/L 108  103  106  CO2 22 - 32 mmol/L '26  24  24   '$ Calcium 8.9 - 10.3 mg/dL 9.3  8.3  8.4   Total Protein 6.5 - 8.1 g/dL 7.5   7.3   Total Bilirubin 0.3 - 1.2 mg/dL 1.2   1.5   Alkaline Phos 38 - 126 U/L 33   44   AST 15 - 41 U/L 12   15   ALT 0 - 44 U/L 8   11    . Lab Results  Component Value Date   LDH 175 06/04/2022     RADIOGRAPHIC STUDIES: I have personally reviewed the radiological images as listed and agreed with the findings in the report. No results found.  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  -Recommended to get a new PCP and follow up for blood pressure.   -refill BP medications x 1 month  -Educated patient on Hgb H disease.  HCT better, LDH WNL. Baseline hgb in the 8.5-9.5 range with Hgb H disease. -continue B complex replacement.   FOLLOW UP: RTC with Dr Irene Limbo as needed  The total time spent in the appointment was 20 minutes* .  All of the patient's questions were answered with apparent satisfaction. The patient knows to call the clinic with any problems, questions or concerns.   Sullivan Lone MD MS AAHIVMS [facility] Hematology/Oncology Physician [facility]  .*Total Encounter Time as defined by the Centers for Medicare and Medicaid Services includes, in addition to the face-to-face time of a patient visit (documented in the note above) non-face-to-face time: obtaining and reviewing outside history, ordering and reviewing medications, tests or procedures, care  coordination (communications with other health care professionals or caregivers) and documentation in the medical record.   I, Param Shah,am acting as a scribe for Sullivan Lone, MD.  .I have reviewed the above documentation for accuracy and completeness, and I agree with the above. Brunetta Genera MD

## 2022-06-06 LAB — FOLATE RBC
Folate, Hemolysate: 233 ng/mL
Folate, RBC: 721 ng/mL (ref >498)
Hematocrit: 32.3 % — ABNORMAL LOW (ref 34.0–46.6)

## 2022-06-12 ENCOUNTER — Other Ambulatory Visit: Payer: Self-pay

## 2022-06-12 ENCOUNTER — Encounter: Payer: Self-pay | Admitting: Family Medicine

## 2022-06-12 ENCOUNTER — Ambulatory Visit (INDEPENDENT_AMBULATORY_CARE_PROVIDER_SITE_OTHER): Payer: Medicare Other | Admitting: Family Medicine

## 2022-06-12 VITALS — BP 223/93 | HR 78 | Ht 62.0 in | Wt 251.2 lb

## 2022-06-12 DIAGNOSIS — Z1159 Encounter for screening for other viral diseases: Secondary | ICD-10-CM | POA: Diagnosis not present

## 2022-06-12 DIAGNOSIS — I10 Essential (primary) hypertension: Secondary | ICD-10-CM | POA: Diagnosis not present

## 2022-06-12 MED ORDER — LOSARTAN POTASSIUM-HCTZ 50-12.5 MG PO TABS: 1.0000 | ORAL_TABLET | Freq: Every day | ORAL | 1 refills | Status: DC

## 2022-06-12 NOTE — Patient Instructions (Signed)
Good to see you today - Thank you for coming in  Things we discussed today:  1) Your Blood Pressure was high today. - Start taking Hyzaar once a day - Stop taking Carvedilol - Come back in 2 wks to recheck your blood pressure and labs.

## 2022-06-12 NOTE — Progress Notes (Unsigned)
    SUBJECTIVE:   CHIEF COMPLAINT / HPI:   SK is a 71yo F w/ hx of HTN, Hgb H that p/f HTN management. She is currently only taking Carvedilol 6.25 (started 1 yr ago at [facility]) and ASA. Denies hx of arryhthmia, afib, HF. Denies SOB, chest pain, vision changes. Misses one dose of meds a week, but overall seems to be consistent with meds.  PERTINENT  PMH / PSH: as above  OBJECTIVE:   BP (!) 223/93   Pulse 78   Ht '5\' 2"'$  (1.575 m)   Wt 251 lb 3.2 oz (113.9 kg)   SpO2 100%   BMI 45.95 kg/m   Gen: Pleasant, older woman. NAD, HEENT: NCAT. MMM Resp: CTAB, no wheezing or crackles. Normal WOB on RA. CV: RRR Ext: No pitting edema  ASSESSMENT/PLAN:   HYPERTENSION, BENIGN ESSENTIAL BP uncontrolled today. Reports taking Carvedilol 6.25 daily. Denies SOB, chest pain, vision changes. Not c/f HTN emergency.  - Start Hyzaar 50-12.5 daily - Stop Carvedilol - f/u in 2 wks to check BMP  Need for hepatitis C screening test HepC lab ordered, will obtain at f/u visit  Arlyce Dice, MD [place]

## 2022-06-13 DIAGNOSIS — Z1159 Encounter for screening for other viral diseases: Secondary | ICD-10-CM | POA: Insufficient documentation

## 2022-06-13 NOTE — Assessment & Plan Note (Signed)
BP uncontrolled today. Reports taking Carvedilol 6.25 daily. Denies SOB, chest pain, vision changes. Not c/f HTN emergency.  - Start Hyzaar 50-12.5 daily - Stop Carvedilol - f/u in 2 wks to check BMP

## 2022-06-13 NOTE — Addendum Note (Signed)
Addended by: Owens Shark, Xhaiden Coombs on: 06/13/2022 05:31 PM   Modules accepted: Level of Service

## 2022-06-13 NOTE — Assessment & Plan Note (Signed)
HepC lab ordered, will obtain at f/u visit

## 2022-07-13 ENCOUNTER — Other Ambulatory Visit: Payer: Self-pay | Admitting: Family Medicine

## 2022-07-13 DIAGNOSIS — I10 Essential (primary) hypertension: Secondary | ICD-10-CM

## 2022-07-18 ENCOUNTER — Other Ambulatory Visit: Payer: Self-pay | Admitting: Hematology

## 2022-07-19 ENCOUNTER — Ambulatory Visit: Payer: 59 | Admitting: Family Medicine

## 2022-08-16 ENCOUNTER — Encounter: Payer: Self-pay | Admitting: Family Medicine

## 2022-08-16 ENCOUNTER — Ambulatory Visit (INDEPENDENT_AMBULATORY_CARE_PROVIDER_SITE_OTHER): Payer: 59 | Admitting: Family Medicine

## 2022-08-16 VITALS — BP 157/54 | HR 60 | Temp 98.6°F | Wt 256.0 lb

## 2022-08-16 DIAGNOSIS — I10 Essential (primary) hypertension: Secondary | ICD-10-CM | POA: Diagnosis not present

## 2022-08-16 DIAGNOSIS — R609 Edema, unspecified: Secondary | ICD-10-CM

## 2022-08-16 DIAGNOSIS — E669 Obesity, unspecified: Secondary | ICD-10-CM

## 2022-08-16 MED ORDER — LOSARTAN POTASSIUM-HCTZ 50-12.5 MG PO TABS: 1.0000 | ORAL_TABLET | Freq: Every day | ORAL | 1 refills | Status: DC

## 2022-08-16 MED ORDER — ROSUVASTATIN CALCIUM 20 MG PO TABS: 20.0000 mg | ORAL_TABLET | Freq: Every day | ORAL | 1 refills | Status: DC

## 2022-08-16 NOTE — Progress Notes (Signed)
    SUBJECTIVE:   CHIEF COMPLAINT / HPI:   Katie Andrews is a 72yo F presenting for follow-up  HTN Pt was switched off carvedilol and start Hyzaar at prior visit for uncontrolled HTN. She reports that she feels "much better" now, reporting that she has less headaches.   BL leg discomfort Reports occasional BL LE throbbing feeling, especially after she eats. Doesn't happen everyday. Started ~2wks ago.   PERTINENT  PMH / PSH: HTN, Hgb H   OBJECTIVE:   BP (!) 157/54   Pulse 60   Temp 98.6 F (37 C)   Wt 256 lb (116.1 kg)   SpO2 99%   BMI 46.82 kg/m   Gen: Pleasant, ambulating with walker. NAD. HEENT: NCAT. MMM. Resp: CTAB. Normal WOB on RA. CV: RRR Abm: soft, nontender, nondistended. Ext: BL LE with trace edema. Nontender to palpation. No pain with moving or standing up.   ASSESSMENT/PLAN:   HYPERTENSION, BENIGN ESSENTIAL 157/54. BP much improved after starting Hyzaar and holding Carvedilol. Subjectively reports decreased HA's. SBP still above goal, but do not want to increase further given low DBP. HR wnl. - Cont Hyzaar 50-12.5 daily - f/u BMP today  Edema Reports occasional BL LE throbbing discomfort that occurs after she eats sometimes. Does not happen everyday. Pain not reproducible w/ palpation and does not seem to affect mobility. Trace edema on exam. Pt has hx of edema in legs and wore compression socks in the past. Low concern for DVT, infxn. - Plan to start wearing compression socks again  Severe obesity (BMI >= 40) - Start Crestor '20mg'$  daily - check lipid panel - ASCVD risk 22.4% @ prior visit     Arlyce Dice, MD [place]

## 2022-08-16 NOTE — Patient Instructions (Signed)
Good to see you today - Thank you for coming in  Things we discussed today:  1) Your blood pressure is so much better today! This is great! - Continue taking your Hyzaar   2) Start taking Crestor (rosuvastatin) 64m once a day.  - This medication lowers your cholesterol, protects your from heart attacks and strokes.  Please always bring your medication bottles  Come back to see me in 6 months for a routine visit.

## 2022-08-16 NOTE — Assessment & Plan Note (Signed)
Reports occasional BL LE throbbing discomfort that occurs after she eats sometimes. Does not happen everyday. Pain not reproducible w/ palpation and does not seem to affect mobility. Trace edema on exam. Pt has hx of edema in legs and wore compression socks in the past. Low concern for DVT, infxn. - Plan to start wearing compression socks again

## 2022-08-16 NOTE — Assessment & Plan Note (Addendum)
-   Start Crestor '20mg'$  daily - check lipid panel - ASCVD risk 22.4% @ prior visit

## 2022-08-16 NOTE — Assessment & Plan Note (Signed)
157/54. BP much improved after starting Hyzaar and holding Carvedilol. Subjectively reports decreased HA's. SBP still above goal, but do not want to increase further given low DBP. HR wnl. - Cont Hyzaar 50-12.5 daily - f/u BMP today

## 2022-08-17 LAB — LIPID PANEL
Chol/HDL Ratio: 2.9 ratio (ref 0.0–4.4)
Cholesterol, Total: 183 mg/dL (ref 100–199)
HDL: 63 mg/dL (ref >39)
LDL Chol Calc (NIH): 105 mg/dL — ABNORMAL HIGH (ref 0–99)
Triglycerides: 80 mg/dL (ref 0–149)
VLDL Cholesterol Cal: 15 mg/dL (ref 5–40)

## 2022-08-17 LAB — BASIC METABOLIC PANEL
BUN/Creatinine Ratio: 32 — ABNORMAL HIGH (ref 12–28)
BUN: 31 mg/dL — ABNORMAL HIGH (ref 8–27)
CO2: 21 mmol/L (ref 20–29)
Calcium: 8.9 mg/dL (ref 8.7–10.3)
Chloride: 101 mmol/L (ref 96–106)
Creatinine, Ser: 0.98 mg/dL (ref 0.57–1.00)
Glucose: 91 mg/dL (ref 70–99)
Potassium: 4.7 mmol/L (ref 3.5–5.2)
Sodium: 136 mmol/L (ref 134–144)
eGFR: 61 mL/min/{1.73_m2} (ref >59)

## 2022-09-18 ENCOUNTER — Telehealth: Payer: Self-pay | Admitting: Family Medicine

## 2022-09-18 NOTE — Telephone Encounter (Signed)
Called patient to schedule Medicare Annual Wellness Visit (AWV). Left message for patient to call back and schedule Medicare Annual Wellness Visit (AWV).  Last date of AWV: AWVI eligible as of 10/31/2015  Please schedule an AWVI appointment at any time with [place].  If any questions, please contact me at [phone].    Thank you,  [place] Direct dial  [phone]

## 2022-11-17 IMAGING — MG MM DIGITAL SCREENING BILAT W/ TOMO AND CAD
8 series · 8 of 24 positions shown · non-contrast
Comparison: Previous exam(s).

CLINICAL DATA: Screening.

EXAM:
DIGITAL SCREENING BILATERAL MAMMOGRAM WITH TOMOSYNTHESIS AND CAD
TECHNIQUE: Bilateral screening digital craniocaudal and mediolateral oblique
mammograms were obtained. Bilateral screening digital breast
tomosynthesis was performed. The images were evaluated with
computer-aided detection.

[R MLO synth-2D]
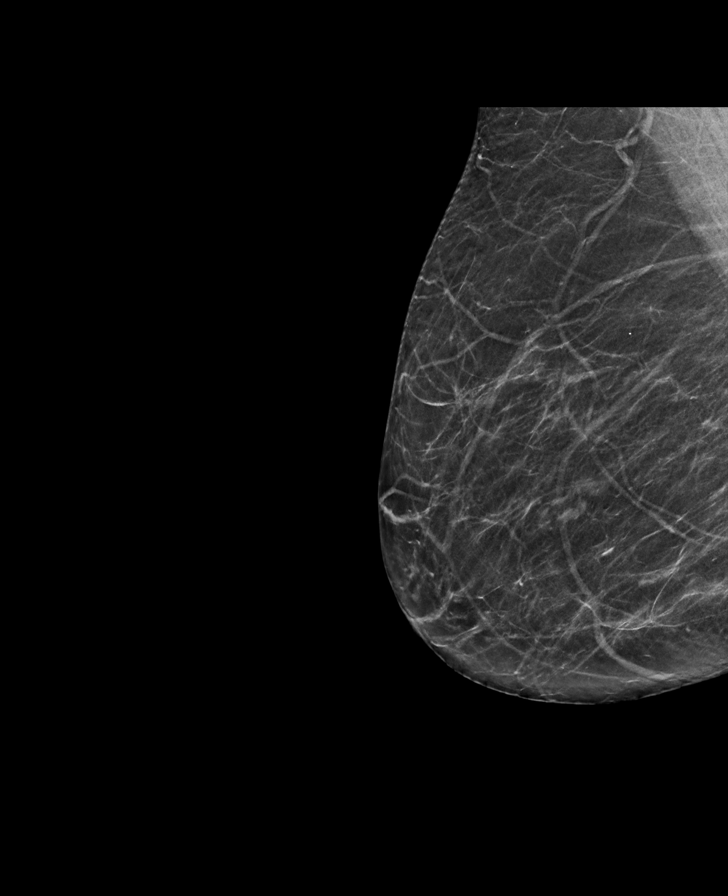

[R CC synth-2D]
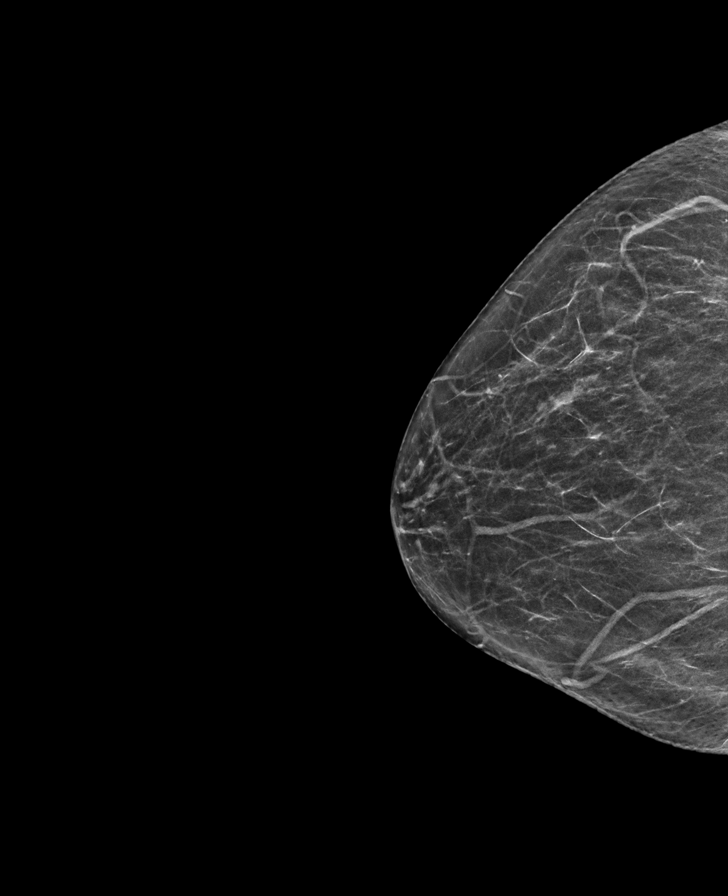

[L CC synth-2D]
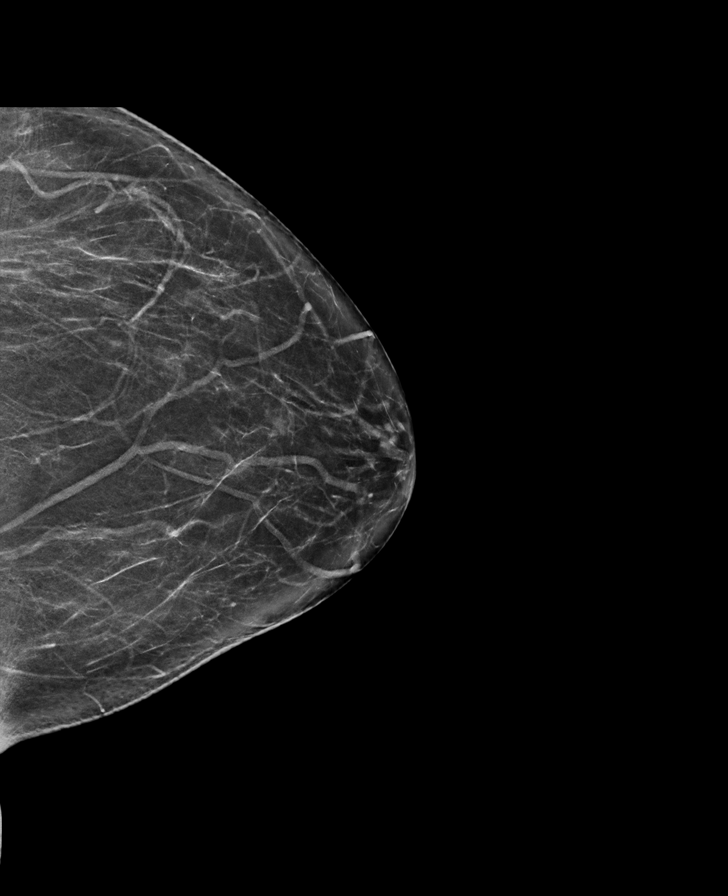

[L MLO synth-2D]
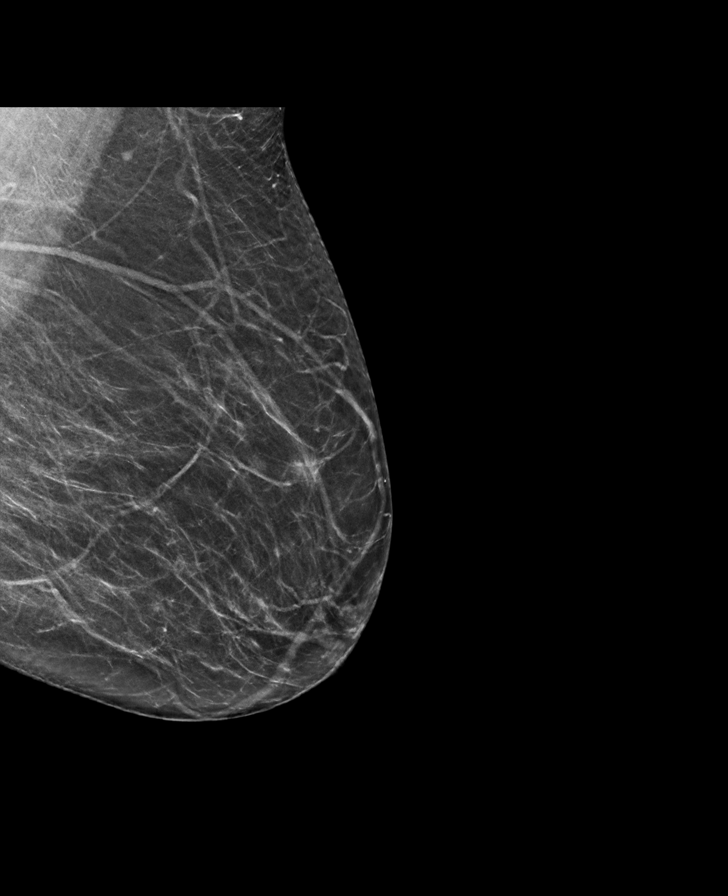

[R MLO tomo · tomo slice 31/61.0]
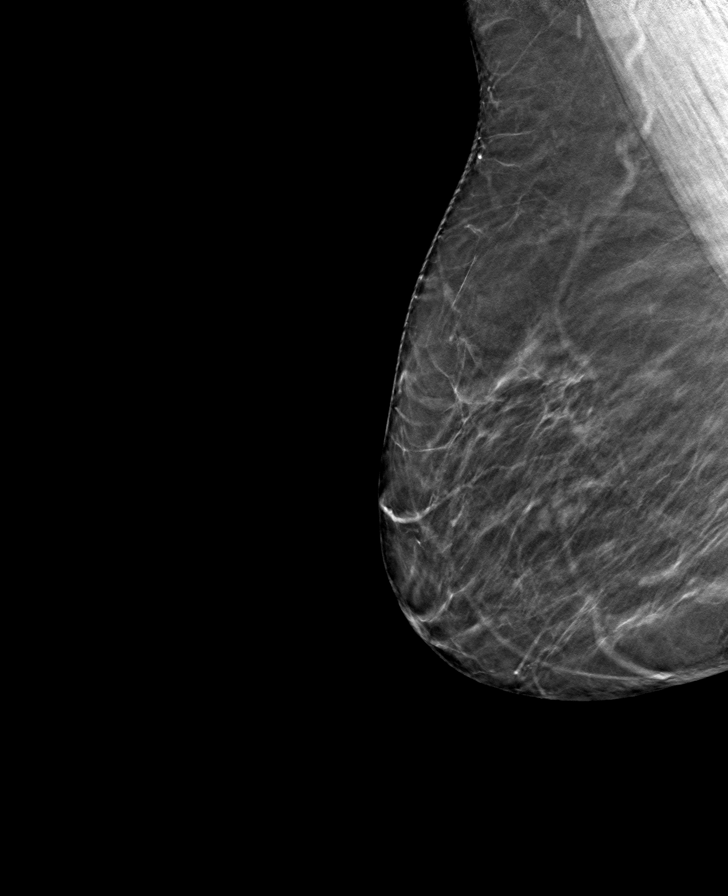

[L CC tomo · tomo slice 34/67.0]
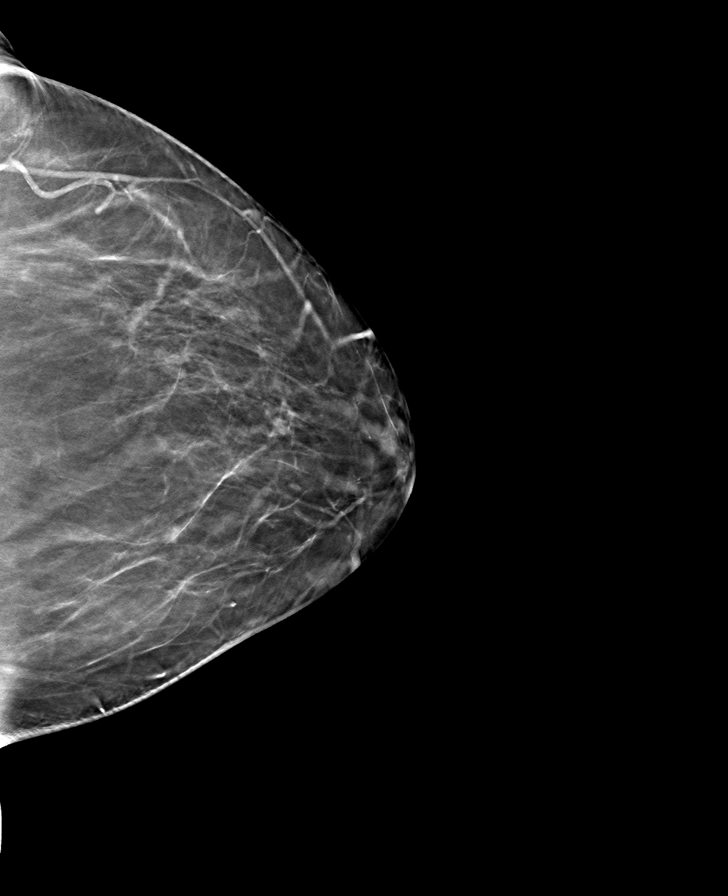

[L MLO tomo · tomo slice 35/68.0]
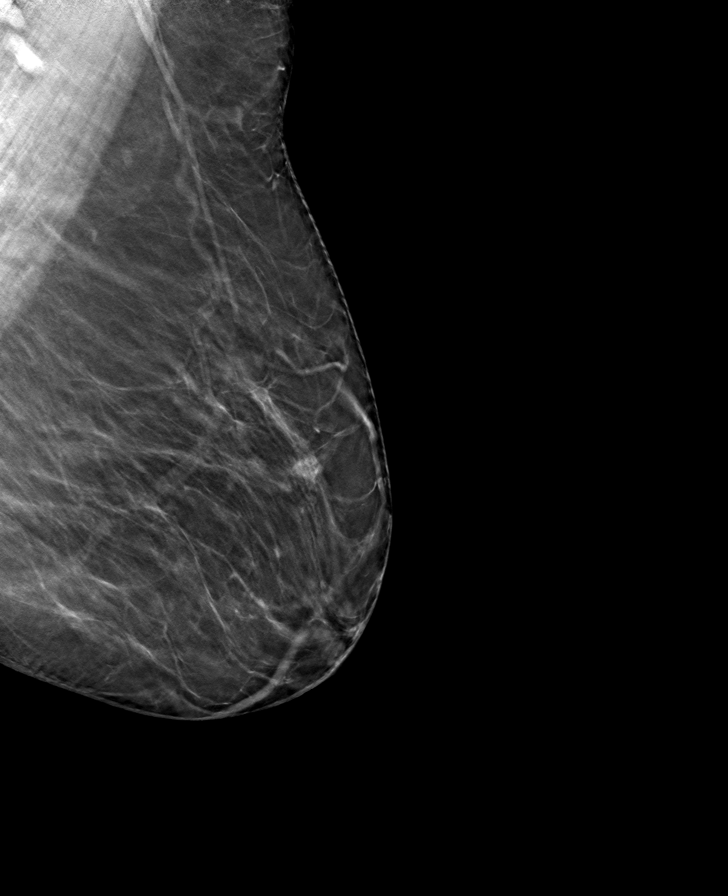

[R CC tomo · tomo slice 29/56.0]
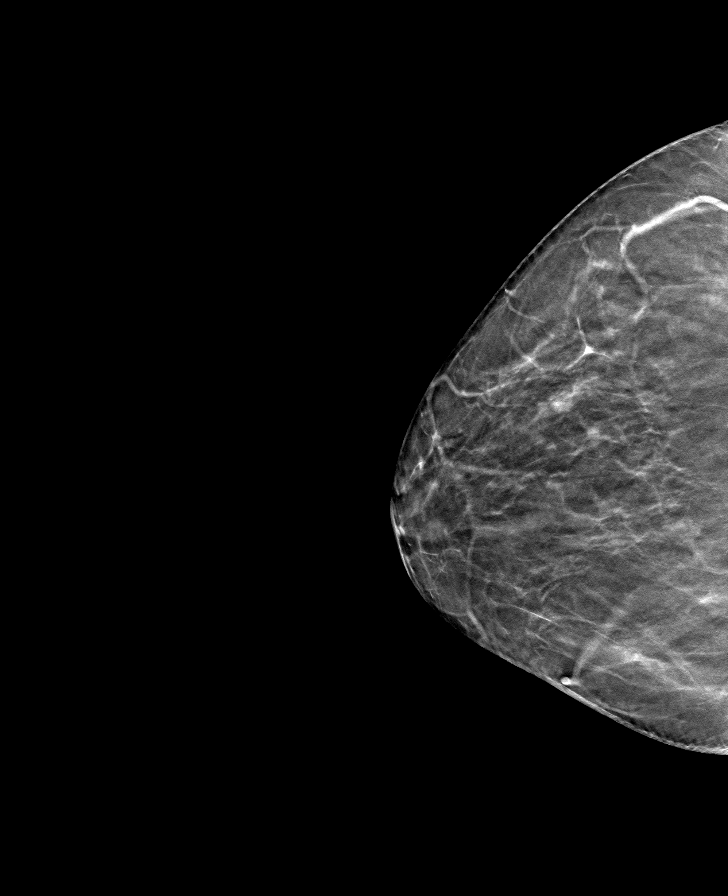

[8 of 24 positions shown; findings below may reference images not displayed]

ACR Breast Density Category b: There are scattered areas of
fibroglandular density.
FINDINGS: There are no findings suspicious for malignancy.
IMPRESSION: No mammographic evidence of malignancy. A result letter of this
screening mammogram will be mailed directly to the patient.

RECOMMENDATION:
Screening mammogram in one year. (Code:51-O-LD2)

BI-RADS CATEGORY  1: Negative.

## 2022-12-18 ENCOUNTER — Other Ambulatory Visit: Payer: Self-pay | Admitting: Family Medicine

## 2022-12-18 DIAGNOSIS — Z1231 Encounter for screening mammogram for malignant neoplasm of breast: Secondary | ICD-10-CM

## 2022-12-31 ENCOUNTER — Other Ambulatory Visit: Payer: Self-pay | Admitting: Student

## 2022-12-31 ENCOUNTER — Ambulatory Visit (HOSPITAL_COMMUNITY)
Admission: RE | Admit: 2022-12-31 | Discharge: 2022-12-31 | Disposition: A | Discharge status: Home / Self Care | Payer: 59 | Source: Ambulatory Visit | Attending: Student | Admitting: Student

## 2022-12-31 ENCOUNTER — Ambulatory Visit (INDEPENDENT_AMBULATORY_CARE_PROVIDER_SITE_OTHER): Payer: 59 | Admitting: Student

## 2022-12-31 ENCOUNTER — Ambulatory Visit
Admission: RE | Admit: 2022-12-31 | Discharge: 2022-12-31 | Disposition: A | Discharge status: Home / Self Care | Payer: 59 | Source: Ambulatory Visit | Attending: Family Medicine | Admitting: Family Medicine

## 2022-12-31 VITALS — BP 170/92 | HR 62 | Ht 62.0 in | Wt 258.0 lb

## 2022-12-31 DIAGNOSIS — R0600 Dyspnea, unspecified: Secondary | ICD-10-CM

## 2022-12-31 DIAGNOSIS — I10 Essential (primary) hypertension: Secondary | ICD-10-CM | POA: Diagnosis not present

## 2022-12-31 DIAGNOSIS — R601 Generalized edema: Secondary | ICD-10-CM

## 2022-12-31 DIAGNOSIS — R001 Bradycardia, unspecified: Secondary | ICD-10-CM

## 2022-12-31 MED ORDER — HYDROCHLOROTHIAZIDE 12.5 MG PO CAPS
12.5000 mg | ORAL_CAPSULE | Freq: Every day | ORAL | 0 refills | Status: DC
Start: 2022-12-31 — End: 2023-01-02

## 2022-12-31 NOTE — Progress Notes (Cosign Needed Addendum)
    SUBJECTIVE:   CHIEF COMPLAINT / HPI:   HTN Takes BP at home weekly - has been running 170's/70's for past ~2 weeks. Has been taking Hyzaar every night. Pt thinks BP is high d/t diet - ate a lot of salty meat last week. States legs and fingers have been swollen since she ate it. Has also been having mild headaches relieved w/ advil and tylenol.  Admits to SOB and wheezing with exertion and with lying flat over last 2 weeks. Denies coughing.  No chest pain and no SOB right now, no light headedness  No nausea, vomiting, or fevers.   PERTINENT  PMH / PSH: HTN, obesity  OBJECTIVE:   BP (!) 170/92   Pulse 62   Ht 5\' 2"  (1.575 m)   Wt 258 lb (117 kg)   SpO2 98%   BMI 47.19 kg/m    Walked around clinic with patient Heart rate before walking: 41  Heart rate after walking: 107 SpO2 before walking: 98% SpO2 after walking: 89% SpO2 after resting for 1 minute: 92%  General: NAD, pleasant, able to participate in exam Cardiac: RRR initially then bradycardic with regular rhythm, no murmurs. Respiratory: CTAB, normal effort at rest, No wheezes, rales, rhonchi, crackles.  Patient became slightly short of breath after walking around clinic Abdomen: Bowel sounds present, nontender, nondistended, no hepatosplenomegaly. Extremities: non pitting edema of BLEs Skin: warm and dry, no rashes noted Neuro: A&O x 4, no focal deficits and CN grossly intact Psych: Normal affect and mood  ASSESSMENT/PLAN:   Bradycardia EKG unchanged from prior with exception of sinus bradycardia at 42 bpm.  Patient was walked around clinic with appropriate response in heart rate which is reassuring.  -TSH to r/o thyroid pathology -CTM HR at next apt. ED precautions given -If bradycardia is persistent and symptomatic, consider referral to cardiology at future visit  HYPERTENSION, BENIGN ESSENTIAL Did decrease on repeat but still uncontrolled. Low concern for endorgan damage or CVA at this time. Has nonpitting  BLE, on chart review this is a chronic problem but worse currently per pt. Can consider volume overload as contributing factor to elevated BP.  -Add HCTZ 12.5 mg in addition to Hyzaar 50-12.5 mg to help control blood pressure and remove possible fluid -Patient to check her blood pressure at home daily and bring with her to next appointment in 2 to 3 days for follow-up -ED precautions given   Dyspnea Orthopnea is concerning for heart failure. Echo in 2020 showed EF 60-65%, will repeat.  Reassuringly, lungs are CTAB and LE edema is non pitting but will obtain CXR to look for cardiomegaly and/or vascular congestion.  Low concern for infection as dyspnea has been going on for weeks without any other sick symptoms. Low concern for MI as she denies chest pain and nausea and EKG shows no evidence suggesting MI. Will rule out anemia with CBC although less likely as I would expect this to cause tachycardia.  Will check TSH and CMP to rule out thyroid dysfunction and kidney and liver pathology which could contribute to edema and dyspnea  -BNP -CMP -CBC -TSH -Echocardiogram  -CXR -ED precautions given    Dr. Erick Alley, DO [facility]

## 2022-12-31 NOTE — Assessment & Plan Note (Addendum)
Did decrease on repeat but still uncontrolled. Low concern for endorgan damage or CVA at this time. Has nonpitting BLE, on chart review this is a chronic problem but worse currently per pt. Can consider volume overload as contributing factor to elevated BP.  -Add HCTZ 12.5 mg in addition to Hyzaar 50-12.5 mg to help control blood pressure and remove possible fluid -Patient to check her blood pressure at home daily and bring with her to next appointment in 2 to 3 days for follow-up -ED precautions given

## 2022-12-31 NOTE — Assessment & Plan Note (Addendum)
Orthopnea is concerning for heart failure. Echo in 2020 showed EF 60-65%, will repeat.  Reassuringly, lungs are CTAB and LE edema is non pitting but will obtain CXR to look for cardiomegaly and/or vascular congestion.  Low concern for infection as dyspnea has been going on for weeks without any other sick symptoms. Low concern for MI as she denies chest pain and nausea and EKG shows no evidence suggesting MI. Will rule out anemia with CBC although less likely as I would expect this to cause tachycardia.  Will check TSH and CMP to rule out thyroid dysfunction and kidney and liver pathology which could contribute to edema and dyspnea  -BNP -CMP -CBC -TSH -Echocardiogram  -CXR -ED precautions given

## 2022-12-31 NOTE — Assessment & Plan Note (Addendum)
EKG unchanged from prior with exception of sinus bradycardia at 42 bpm.  Patient was walked around clinic with appropriate response in heart rate which is reassuring.  -TSH to r/o thyroid pathology -CTM HR at next apt. ED precautions given -If bradycardia is persistent and symptomatic, consider referral to cardiology at future visit

## 2022-12-31 NOTE — Patient Instructions (Addendum)
It was great to see you! Thank you for allowing me to participate in your care!  Our plans for today:  - Go to [facility] at [address] for a chest x ray today. You do not need an appointment - I prescribed you hydrochlorothiazide 12.5 mg. Take this in addition to your Hyzaar -Return in 2-3 days for a follow up apt - schedule this apt on your way out at the front desk  -If you begin to feel dizzy, feel like you may pass out, have changes in vision, chest pain, nausea and vomiting, feel like you may pass out, please return or go to the emergency department   We are checking some labs today, I will call you if they are abnormal will send you a MyChart message or a letter if they are normal.  If you do not hear about your labs in the next 2 weeks please let us know.  Take care and seek immediate care sooner if you develop any concerns.   Dr. Erick Alley, DO [facility]

## 2023-01-01 LAB — CBC
Hematocrit: 26.8 % — ABNORMAL LOW (ref 34.0–46.6)
Hemoglobin: 7.6 g/dL — ABNORMAL LOW (ref 11.1–15.9)
MCH: 17 pg — ABNORMAL LOW (ref 26.6–33.0)
MCHC: 28.4 g/dL — ABNORMAL LOW (ref 31.5–35.7)
MCV: 60 fL — ABNORMAL LOW (ref 79–97)
Platelets: 105 10*3/uL — ABNORMAL LOW (ref 150–450)
RBC: 4.48 x10E6/uL (ref 3.77–5.28)
RDW: 29.1 % — ABNORMAL HIGH (ref 11.7–15.4)
WBC: 3.7 10*3/uL (ref 3.4–10.8)

## 2023-01-01 LAB — COMPREHENSIVE METABOLIC PANEL
ALT: 16 IU/L (ref 0–32)
AST: 20 IU/L (ref 0–40)
Albumin: 4.1 g/dL (ref 3.8–4.8)
Alkaline Phosphatase: 34 IU/L — ABNORMAL LOW (ref 44–121)
BUN/Creatinine Ratio: 11 — ABNORMAL LOW (ref 12–28)
BUN: 8 mg/dL (ref 8–27)
Bilirubin Total: 2 mg/dL — ABNORMAL HIGH (ref 0.0–1.2)
CO2: 21 mmol/L (ref 20–29)
Calcium: 8.6 mg/dL — ABNORMAL LOW (ref 8.7–10.3)
Chloride: 105 mmol/L (ref 96–106)
Creatinine, Ser: 0.7 mg/dL (ref 0.57–1.00)
Globulin, Total: 2.5 g/dL (ref 1.5–4.5)
Glucose: 88 mg/dL (ref 70–99)
Potassium: 3.9 mmol/L (ref 3.5–5.2)
Sodium: 139 mmol/L (ref 134–144)
Total Protein: 6.6 g/dL (ref 6.0–8.5)
eGFR: 91 mL/min/{1.73_m2} (ref >59)

## 2023-01-01 LAB — TSH: TSH: 2.01 u[IU]/mL (ref 0.450–4.500)

## 2023-01-01 LAB — BRAIN NATRIURETIC PEPTIDE: BNP: 172.9 pg/mL — ABNORMAL HIGH (ref 0.0–100.0)

## 2023-01-02 ENCOUNTER — Encounter: Payer: Self-pay | Admitting: Family Medicine

## 2023-01-02 ENCOUNTER — Ambulatory Visit (INDEPENDENT_AMBULATORY_CARE_PROVIDER_SITE_OTHER): Payer: 59 | Admitting: Family Medicine

## 2023-01-02 VITALS — BP 182/70 | HR 46 | Ht 63.0 in | Wt 255.8 lb

## 2023-01-02 DIAGNOSIS — I10 Essential (primary) hypertension: Secondary | ICD-10-CM | POA: Diagnosis not present

## 2023-01-02 DIAGNOSIS — R609 Edema, unspecified: Secondary | ICD-10-CM

## 2023-01-02 DIAGNOSIS — D509 Iron deficiency anemia, unspecified: Secondary | ICD-10-CM | POA: Diagnosis not present

## 2023-01-02 DIAGNOSIS — D560 Alpha thalassemia: Secondary | ICD-10-CM | POA: Diagnosis not present

## 2023-01-02 MED ORDER — FUROSEMIDE 20 MG PO TABS
20.0000 mg | ORAL_TABLET | Freq: Every day | ORAL | 1 refills | Status: DC
Start: 2023-01-02 — End: 2023-10-29

## 2023-01-02 MED ORDER — LOSARTAN POTASSIUM 100 MG PO TABS
100.0000 mg | ORAL_TABLET | Freq: Every day | ORAL | 1 refills | Status: DC
Start: 2023-01-02 — End: 2023-01-16

## 2023-01-02 NOTE — Patient Instructions (Addendum)
Good to see you today - Thank you for coming in  Things we discussed today:  1) I am worried that you may have Heart Failure, which is when your heart is overstressed and starts to get weaker, and then your body starts to build up too much fluid. This is most likely due to high blood pressure. - Make sure you go to get your echocardiogram done on Friday - I will refer you to see a Cardiologist (a heart doctor) - Start taking Losartan 100mg  once a day - Start taking Lasix 20mg  once a day. This will help you pee more fluid out. - STOP taking your Hyzaar and Hydrochlorothiazide.   2) You also have anemia.  - Please take your iron supplement once a day consistently. - I will check your iron level today  Please seek emergency medical attention if: - You start having trouble breathing - You start having chest pain - You feel dizzy - You faint or lose consciousness  Please always bring your medication bottles  Come back to see me in 1 week

## 2023-01-02 NOTE — Progress Notes (Signed)
    SUBJECTIVE:   CHIEF COMPLAINT / HPI:   Katie Andrews is a 72yo F w/ hx of HTN, bradycardia, hemoglobin H that p/f follow-up of HTN.   HTN  Edema  Bradycardia - Was seen in clinic 7/1 and found to be hypertensive 170/92 and HCTZ was increased.  Patient was also found to be bradycardic, EKG showed sinus bradycardia.  Also found to have lower extremity edema, BNP was elevated and CXR showed cardiomegaly and vascular congestion (my interpretation, pending official read).  -Reassuringly, patient denies dizziness, vision changes, symptoms with moving from sitting to standing position. -Since increasing the HCTZ, she feels subjectively better. -Reports trouble breathing when she lays down, and has to sleep with 2 pillows to keep her head elevated. -She does take an iron supplement about 3 times a week, but does not take it consistently. - Has echo this Friday  OBJECTIVE:   BP (!) 182/70   Pulse (!) 46   Ht 5\' 3"  (1.6 m)   Wt 255 lb 12.8 oz (116 kg)   SpO2 97%   BMI 45.31 kg/m   General: Alert, pleasant older woman sitting comfortably in chair, responding in full sentences. NAD. HEENT: NCAT. MMM. CV: Slow rate, regular rhythm. Systolic 2/6 murmur best heard at RUSB.1+ BL pitting edema. Resp: CTAB, no wheezing or crackles. Normal WOB on RA.  Abm: Soft, nontender, nondistended. BS present. Ext: Moves all ext spontaneously Skin: Warm, well perfused   ASSESSMENT/PLAN:   Katie Andrews is 72yo F w/ hx of HTN that p/w uncontrolled HTN, bradycardia, and fluid overload.  Evidence of fluid overload include increased BNP, suspected cardiomegaly and vascular congestion on chest x-ray (my interpretation, pending official read), lower extremity edema, orthopnea.  Suspect that the fluid overload and bradycardia are secondary to chronic uncontrolled hypertension.  Reassuringly, patient is mentating well, denies dizziness/shortness of breath/chest pain, Cr at baseline, TSH wnl.  Patient is scheduled for echo this Friday.   Given fluid overload, will start patient on Lasix.  To avoid hypokalemia, will stop hydrochlorothiazide.  To further control blood pressure, will increase losartan.  Will also send referral to see cardiology.  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  CBC showed hemoglobin 7.6, which is lower than her baseline of around 8.8-9.  Ferritin was checked today, was elevated.  Takes an oral iron supplement, but takes it inconsistently about 3 times a week. -Encouraged to take oral iron supplement daily. -Consider reconnecting patient with hematologist if agreeable.  HYPERTENSION, BENIGN ESSENTIAL  Uncontrolled. -Referral to cardiology - stop hydrochlorothiazide and Hyzaar - Increase Losartan to 100mg  daily - f/u in 1 week  Edema Start Lasix 20 mg daily   Katie Brigham, MD [facility]

## 2023-01-03 LAB — FERRITIN: Ferritin: 259 ng/mL — ABNORMAL HIGH (ref 15–150)

## 2023-01-04 ENCOUNTER — Ambulatory Visit (HOSPITAL_COMMUNITY)
Admission: RE | Admit: 2023-01-04 | Discharge: 2023-01-04 | Disposition: A | Discharge status: Home / Self Care | Payer: 59 | Source: Ambulatory Visit | Attending: Family Medicine | Admitting: Family Medicine

## 2023-01-04 ENCOUNTER — Encounter: Payer: Self-pay | Admitting: Student

## 2023-01-04 DIAGNOSIS — I071 Rheumatic tricuspid insufficiency: Secondary | ICD-10-CM | POA: Insufficient documentation

## 2023-01-04 DIAGNOSIS — I3139 Other pericardial effusion (noninflammatory): Secondary | ICD-10-CM | POA: Diagnosis not present

## 2023-01-04 DIAGNOSIS — R0600 Dyspnea, unspecified: Secondary | ICD-10-CM | POA: Diagnosis not present

## 2023-01-04 DIAGNOSIS — R0609 Other forms of dyspnea: Secondary | ICD-10-CM

## 2023-01-04 LAB — ECHOCARDIOGRAM COMPLETE
AR max vel: 2.08 cm2
AV Area VTI: 2.31 cm2
AV Area mean vel: 2.35 cm2
AV Mean grad: 8 mmHg
AV Peak grad: 15.5 mmHg
Ao pk vel: 1.97 m/s
Area-P 1/2: 3.53 cm2
S' Lateral: 3.9 cm

## 2023-01-04 NOTE — Assessment & Plan Note (Addendum)
Uncontrolled. - Referral to cardiology - stop hydrochlorothiazide and Hyzaar - Increase Losartan to 100mg  daily - f/u in 1 week. If still elevated, can consider amlodipine

## 2023-01-04 NOTE — Assessment & Plan Note (Signed)
Start Lasix 20 mg daily

## 2023-01-04 NOTE — Assessment & Plan Note (Signed)
Patient has history of hemoglobin H disease, previously had poor experience at hematology office and has not followed up with them.  CBC showed hemoglobin 7.6, which is lower than her baseline of around 8.8-9.  Ferritin was checked today, was elevated.  Takes an oral iron supplement, but takes it inconsistently about 3 times a week. -Encouraged to take oral iron supplement daily. -Consider reconnecting patient with hematologist if agreeable.

## 2023-01-07 ENCOUNTER — Telehealth: Payer: Self-pay

## 2023-01-07 NOTE — Telephone Encounter (Signed)
Patient calls nurse line requesting results from ECHO.   Advised patient a letter was mailed to her home that she should receive shortly.   Discussed results with patient and the need for a Cardiologist. Advised if she does not hear from someone in the next two weeks to reach back out to [place].   Patient reminded of PCP apt on 7/12.

## 2023-01-11 ENCOUNTER — Ambulatory Visit: Payer: Self-pay | Admitting: Family Medicine

## 2023-01-14 ENCOUNTER — Ambulatory Visit (INDEPENDENT_AMBULATORY_CARE_PROVIDER_SITE_OTHER): Payer: 59

## 2023-01-14 VITALS — Ht 63.0 in | Wt 255.0 lb

## 2023-01-14 DIAGNOSIS — Z0000 Encounter for general adult medical examination without abnormal findings: Secondary | ICD-10-CM | POA: Diagnosis not present

## 2023-01-14 NOTE — Progress Notes (Addendum)
Subjective:   Katie Andrews is a 72 y.o. female who presents for an Initial Medicare Annual Wellness Visit.  Visit Complete: Virtual  I connected with  Katie Andrews on 01/14/23 by a audio enabled telemedicine application and verified that I am speaking with the correct person using two identifiers.  Patient Location: Home  Provider Location: Home Office  I discussed the limitations of evaluation and management by telemedicine. The patient expressed understanding and agreed to proceed.  Review of Systems     Cardiac Risk Factors include: advanced age (>59men, >72 women);hypertension;obesity (BMI >30kg/m2)  Per patient no change in vitals since last visit, unable to obtain new vitals due to telehealth visit     Objective:    Today's Vitals   01/14/23 1532  Weight: 255 lb (115.7 kg)  Height: 5\' 3"  (1.6 m)   Body mass index is 45.17 kg/m.     01/14/2023    3:48 PM 12/31/2022    9:34 AM 08/16/2022    1:47 PM 06/12/2022   10:04 AM 07/16/2021    6:42 AM 10/18/2020    1:38 PM 09/28/2020   10:37 AM  Advanced Directives  Does Patient Have a Medical Advance Directive? No No No No No No No  Would patient like information on creating a medical advance directive? Yes (MAU/Ambulatory/Procedural Areas - Information given) No - Patient declined  No - Patient declined No - Patient declined No - Patient declined No - Patient declined    Current Medications (verified) Outpatient Encounter Medications as of 01/14/2023  Medication Sig   aspirin EC 81 MG tablet Take 81 mg by mouth daily.   fluticasone (FLONASE) 50 MCG/ACT nasal spray Place 1 spray into both nostrils daily.   furosemide (LASIX) 20 MG tablet Take 1 tablet (20 mg total) by mouth daily.   losartan (COZAAR) 100 MG tablet Take 1 tablet (100 mg total) by mouth daily.   rosuvastatin (CRESTOR) 20 MG tablet Take 1 tablet (20 mg total) by mouth daily.   No facility-administered encounter medications on file as of 01/14/2023.     Allergies (verified) Patient has no known allergies.   History: Past Medical History:  Diagnosis Date   Anemia    Hypertension    History reviewed. No pertinent surgical history. Family History  Problem Relation Age of Onset   Diabetes Mother    Hypertension Mother    Breast cancer Neg Hx    Social History   Socioeconomic History   Marital status: Widowed    Spouse name: Not on file   Number of children: Not on file   Years of education: Not on file   Highest education level: Not on file  Occupational History   Not on file  Tobacco Use   Smoking status: Never   Smokeless tobacco: Never  Substance and Sexual Activity   Alcohol use: No   Drug use: No   Sexual activity: Not on file  Other Topics Concern   Not on file  Social History Narrative   Not on file   Social Determinants of Health   Financial Resource Strain: Not on file  Food Insecurity: Not on file  Transportation Needs: Not on file  Physical Activity: Not on file  Stress: Not on file  Social Connections: Not on file    Tobacco Counseling Counseling given: Not Answered   Clinical Intake:  Pre-visit preparation completed: Yes  Pain : No/denies pain     Diabetes: No  How often do you  need to have someone help you when you read instructions, pamphlets, or other written materials from your doctor or pharmacy?: 1 - Never  Interpreter Needed?: No  Information entered by :: Kandis Fantasia LPN   Activities of Daily Living    01/14/2023    3:46 PM  In your present state of health, do you have any difficulty performing the following activities:  Hearing? 0  Vision? 0  Difficulty concentrating or making decisions? 0  Walking or climbing stairs? 0  Dressing or bathing? 0  Doing errands, shopping? 0  Preparing Food and eating ? N  Using the Toilet? N  In the past six months, have you accidently leaked urine? N  Do you have problems with loss of bowel control? N  Managing your  Medications? N  Managing your Finances? N  Housekeeping or managing your Housekeeping? N    Patient Care Team: Lincoln Brigham, MD as PCP - General (Family Medicine)  Indicate any recent Medical Services you may have received from other than [facility] providers in the past year (date may be approximate).     Assessment:   This is a routine wellness examination for Katie Andrews.  Hearing/Vision screen Hearing Screening - Comments:: Denies hearing difficulties   Vision Screening - Comments:: Wears rx glasses - up to date with routine eye exams    Dietary issues and exercise activities discussed:     Goals Addressed               This Visit's Progress     Remain active and independent (pt-stated)         Depression Screen    01/02/2023   11:03 AM 12/31/2022    9:33 AM 08/16/2022    1:48 PM 06/12/2022   12:32 PM 10/18/2020    1:38 PM 09/28/2020   10:37 AM 08/01/2020    2:36 PM  PHQ 2/9 Scores  PHQ - 2 Score 1 2 2 1  0 0 0  PHQ- 9 Score 3 5 5 4  0 2     Fall Risk    01/14/2023    3:45 PM 12/31/2022    9:34 AM 08/16/2022    1:48 PM 06/12/2022   10:04 AM 10/18/2020    1:38 PM  Fall Risk   Falls in the past year? 0 0 0 0 0  Number falls in past yr: 0 0  0   Injury with Fall? 0 0  0   Risk for fall due to : No Fall Risks      Follow up Falls prevention discussed;Education provided;Falls evaluation completed Falls evaluation completed       MEDICARE RISK AT HOME:  Medicare Risk at Home - 01/14/23 1545     Any stairs in or around the home? No    If so, are there any without handrails? No    Home free of loose throw rugs in walkways, pet beds, electrical cords, etc? Yes    Adequate lighting in your home to reduce risk of falls? Yes    Life alert? No    Use of a cane, walker or w/c? No    Grab bars in the bathroom? Yes    Shower chair or bench in shower? Yes    Elevated toilet seat or a handicapped toilet? Yes             TIMED UP AND GO:  Was the test performed? No     Cognitive Function:  01/14/2023    3:48 PM  6CIT Screen  What Year? 0 points  What month? 0 points  What time? 0 points  Count back from 20 0 points  Months in reverse 0 points  Repeat phrase 0 points  Total Score 0 points    Immunizations Immunization History  Administered Date(s) Administered   Influenza, High Dose Seasonal PF 03/27/2018   Influenza,inj,Quad PF,6+ Mos 05/05/2014   PFIZER(Purple Top)SARS-COV-2 Vaccination 08/23/2019, 09/15/2019, 04/16/2020   Td 09/19/2009   Zoster Recombinant(Shingrix) 02/06/2019, 05/01/2019    TDAP status: Due, Education has been provided regarding the importance of this vaccine. Advised may receive this vaccine at local pharmacy or Health Dept. Aware to provide a copy of the vaccination record if obtained from local pharmacy or Health Dept. Verbalized acceptance and understanding.  Pneumococcal vaccine status: Due, Education has been provided regarding the importance of this vaccine. Advised may receive this vaccine at local pharmacy or Health Dept. Aware to provide a copy of the vaccination record if obtained from local pharmacy or Health Dept. Verbalized acceptance and understanding.  Covid-19 vaccine status: Information provided on how to obtain vaccines.   Qualifies for Shingles Vaccine? Yes   Zostavax completed No   Shingrix Completed?: Yes  Screening Tests Health Maintenance  Topic Date Due   Medicare Annual Wellness (AWV)  Never done   Hepatitis C Screening  Never done   Pneumonia Vaccine 6+ Years old (1 of 1 - PCV) Never done   DTaP/Tdap/Td (2 - Tdap) 09/20/2019   COVID-19 Vaccine (4 - 2023-24 season) 03/02/2022   INFLUENZA VACCINE  01/31/2023   MAMMOGRAM  01/27/2024   Colonoscopy  10/13/2024   DEXA SCAN  Completed   Zoster Vaccines- Shingrix  Completed   HPV VACCINES  Aged Out    Health Maintenance  Health Maintenance Due  Topic Date Due   Medicare Annual Wellness (AWV)  Never done   Hepatitis C Screening   Never done   Pneumonia Vaccine 21+ Years old (1 of 1 - PCV) Never done   DTaP/Tdap/Td (2 - Tdap) 09/20/2019   COVID-19 Vaccine (4 - 2023-24 season) 03/02/2022    Colorectal cancer screening: Type of screening: Colonoscopy. Completed 10/14/14. Repeat every 10 years  Mammogram status: Ordered and scheduled for 01/29/23. Pt provided with contact info and advised to call to schedule appt.   Bone Density status: Completed 07/16/18. Results reflect: Bone density results: OSTEOPENIA. Repeat every 2 years.  Lung Cancer Screening: (Low Dose CT Chest recommended if Age 27-80 years, 20 pack-year currently smoking OR have quit w/in 15years.) does not qualify.   Lung Cancer Screening Referral: n/a  Additional Screening:  Hepatitis C Screening: does qualify  Vision Screening: Recommended annual ophthalmology exams for early detection of glaucoma and other disorders of the eye. Is the patient up to date with their annual eye exam?  Yes  Who is the provider or what is the name of the office in which the patient attends annual eye exams? Unable to provide name  If pt is not established with a provider, would they like to be referred to a provider to establish care? No .   Dental Screening: Recommended annual dental exams for proper oral hygiene  Community Resource Referral / Chronic Care Management: CRR required this visit?  No   CCM required this visit?  No     Plan:     I have personally reviewed and noted the following in the patient's chart:   Medical and social history Use  of alcohol, tobacco or illicit drugs  Current medications and supplements including opioid prescriptions. Patient is not currently taking opioid prescriptions. Functional ability and status Nutritional status Physical activity Advanced directives List of other physicians Hospitalizations, surgeries, and ER visits in previous 12 months Vitals Screenings to include cognitive, depression, and falls Referrals and  appointments  In addition, I have reviewed and discussed with patient certain preventive protocols, quality metrics, and best practice recommendations. A written personalized care plan for preventive services as well as general preventive health recommendations were provided to patient.     Kandis Fantasia [place], [place]   07/20/1476   After Visit Summary: (Mail) Due to this being a telephonic visit, the after visit summary with patients personalized plan was offered to patient via mail   Nurse Notes: No concerns       I have reviewed this visit and agree with the documentation.  Marshall L Chambliss

## 2023-01-14 NOTE — Patient Instructions (Addendum)
[name] , Thank you for taking time to come for your Medicare Wellness Visit. I appreciate your ongoing commitment to your health goals. Please review the following plan we discussed and let me know if I can assist you in the future.   These are the goals we discussed:  Goals       Remain active and independent (pt-stated)        This is a list of the screening recommended for you and due dates:  Health Maintenance  Topic Date Due   Medicare Annual Wellness Visit  Never done   Hepatitis C Screening  Never done   Pneumonia Vaccine (1 of 1 - PCV) Never done   DTaP/Tdap/Td vaccine (2 - Tdap) 09/20/2019   COVID-19 Vaccine (4 - 2023-24 season) 03/02/2022   Flu Shot  01/31/2023   Mammogram  01/27/2024   Colon Cancer Screening  10/13/2024   DEXA scan (bone density measurement)  Completed   Zoster (Shingles) Vaccine  Completed   HPV Vaccine  Aged Out    Advanced directives: Information on Advanced Care Planning can be found at [facility] Advance Health Care Directives Advance Health Care Directives ([email or web address] Please bring a copy of your health care power of attorney and living will to the office to be added to your chart at your convenience.  Conditions/risks identified: Aim for 30 minutes of exercise or brisk walking, 6-8 glasses of water, and 5 servings of fruits and vegetables each day.  Next appointment: Follow up in one year for your annual wellness visit    Preventive Care 65 Years and Older, Female Preventive care refers to lifestyle choices and visits with your health care provider that can promote health and wellness. What does preventive care include? A yearly physical exam. This is also called an annual well check. Dental exams once or twice a year. Routine eye exams. Ask your health care provider how often you should have your eyes checked. Personal lifestyle choices, including: Daily care of your teeth and gums. Regular physical  activity. Eating a healthy diet. Avoiding tobacco and drug use. Limiting alcohol use. Practicing safe sex. Taking low-dose aspirin every day. Taking vitamin and mineral supplements as recommended by your health care provider. What happens during an annual well check? The services and screenings done by your health care provider during your annual well check will depend on your age, overall health, lifestyle risk factors, and family history of disease. Counseling  Your health care provider may ask you questions about your: Alcohol use. Tobacco use. Drug use. Emotional well-being. Home and relationship well-being. Sexual activity. Eating habits. History of falls. Memory and ability to understand (cognition). Work and work Astronomer. Reproductive health. Screening  You may have the following tests or measurements: Height, weight, and BMI. Blood pressure. Lipid and cholesterol levels. These may be checked every 5 years, or more frequently if you are over 41 years old. Skin check. Lung cancer screening. You may have this screening every year starting at age 39 if you have a 30-pack-year history of smoking and currently smoke or have quit within the past 15 years. Fecal occult blood test (FOBT) of the stool. You may have this test every year starting at age 3. Flexible sigmoidoscopy or colonoscopy. You may have a sigmoidoscopy every 5 years or a colonoscopy every 10 years starting at age 44. Hepatitis C blood test. Hepatitis B blood test. Sexually transmitted disease (STD) testing. Diabetes screening. This is done by checking your blood  sugar (glucose) after you have not eaten for a while (fasting). You may have this done every 1-3 years. Bone density scan. This is done to screen for osteoporosis. You may have this done starting at age 50. Mammogram. This may be done every 1-2 years. Talk to your health care provider about how often you should have regular mammograms. Talk with your  health care provider about your test results, treatment options, and if necessary, the need for more tests. Vaccines  Your health care provider may recommend certain vaccines, such as: Influenza vaccine. This is recommended every year. Tetanus, diphtheria, and acellular pertussis (Tdap, Td) vaccine. You may need a Td booster every 10 years. Zoster vaccine. You may need this after age 63. Pneumococcal 13-valent conjugate (PCV13) vaccine. One dose is recommended after age 94. Pneumococcal polysaccharide (PPSV23) vaccine. One dose is recommended after age 1. Talk to your health care provider about which screenings and vaccines you need and how often you need them. This information is not intended to replace advice given to you by your health care provider. Make sure you discuss any questions you have with your health care provider. Document Released: 07/15/2015 Document Revised: 03/07/2016 Document Reviewed: 04/19/2015 Elsevier Interactive Patient Education  2017 [employer].  Fall Prevention in the Home Falls can cause injuries. They can happen to people of all ages. There are many things you can do to make your home safe and to help prevent falls. What can I do on the outside of my home? Regularly fix the edges of walkways and driveways and fix any cracks. Remove anything that might make you trip as you walk through a door, such as a raised step or threshold. Trim any bushes or trees on the path to your home. Use bright outdoor lighting. Clear any walking paths of anything that might make someone trip, such as rocks or tools. Regularly check to see if handrails are loose or broken. Make sure that both sides of any steps have handrails. Any raised decks and porches should have guardrails on the edges. Have any leaves, snow, or ice cleared regularly. Use sand or salt on walking paths during winter. Clean up any spills in your garage right away. This includes oil or grease spills. What can I  do in the bathroom? Use night lights. Install grab bars by the toilet and in the tub and shower. Do not use towel bars as grab bars. Use non-skid mats or decals in the tub or shower. If you need to sit down in the shower, use a plastic, non-slip stool. Keep the floor dry. Clean up any water that spills on the floor as soon as it happens. Remove soap buildup in the tub or shower regularly. Attach bath mats securely with double-sided non-slip rug tape. Do not have throw rugs and other things on the floor that can make you trip. What can I do in the bedroom? Use night lights. Make sure that you have a light by your bed that is easy to reach. Do not use any sheets or blankets that are too big for your bed. They should not hang down onto the floor. Have a firm chair that has side arms. You can use this for support while you get dressed. Do not have throw rugs and other things on the floor that can make you trip. What can I do in the kitchen? Clean up any spills right away. Avoid walking on wet floors. Keep items that you use a lot in easy-to-reach  places. If you need to reach something above you, use a strong step stool that has a grab bar. Keep electrical cords out of the way. Do not use floor polish or wax that makes floors slippery. If you must use wax, use non-skid floor wax. Do not have throw rugs and other things on the floor that can make you trip. What can I do with my stairs? Do not leave any items on the stairs. Make sure that there are handrails on both sides of the stairs and use them. Fix handrails that are broken or loose. Make sure that handrails are as long as the stairways. Check any carpeting to make sure that it is firmly attached to the stairs. Fix any carpet that is loose or worn. Avoid having throw rugs at the top or bottom of the stairs. If you do have throw rugs, attach them to the floor with carpet tape. Make sure that you have a light switch at the top of the stairs  and the bottom of the stairs. If you do not have them, ask someone to add them for you. What else can I do to help prevent falls? Wear shoes that: Do not have high heels. Have rubber bottoms. Are comfortable and fit you well. Are closed at the toe. Do not wear sandals. If you use a stepladder: Make sure that it is fully opened. Do not climb a closed stepladder. Make sure that both sides of the stepladder are locked into place. Ask someone to hold it for you, if possible. Clearly mark and make sure that you can see: Any grab bars or handrails. First and last steps. Where the edge of each step is. Use tools that help you move around (mobility aids) if they are needed. These include: Canes. Walkers. Scooters. Crutches. Turn on the lights when you go into a dark area. Replace any light bulbs as soon as they burn out. Set up your furniture so you have a clear path. Avoid moving your furniture around. If any of your floors are uneven, fix them. If there are any pets around you, be aware of where they are. Review your medicines with your doctor. Some medicines can make you feel dizzy. This can increase your chance of falling. Ask your doctor what other things that you can do to help prevent falls. This information is not intended to replace advice given to you by your health care provider. Make sure you discuss any questions you have with your health care provider. Document Released: 04/14/2009 Document Revised: 11/24/2015 Document Reviewed: 07/23/2014 Elsevier Interactive Patient Education  2017 [employer].

## 2023-01-16 ENCOUNTER — Ambulatory Visit: Payer: 59 | Admitting: Family Medicine

## 2023-01-16 ENCOUNTER — Encounter: Payer: Self-pay | Admitting: Family Medicine

## 2023-01-16 ENCOUNTER — Other Ambulatory Visit: Payer: Self-pay

## 2023-01-16 VITALS — BP 190/49 | HR 46 | Ht 63.0 in | Wt 248.6 lb

## 2023-01-16 DIAGNOSIS — I10 Essential (primary) hypertension: Secondary | ICD-10-CM

## 2023-01-16 DIAGNOSIS — D560 Alpha thalassemia: Secondary | ICD-10-CM

## 2023-01-16 MED ORDER — AMLODIPINE BESYLATE 5 MG PO TABS
5.0000 mg | ORAL_TABLET | Freq: Every day | ORAL | 1 refills | Status: DC
Start: 2023-01-16 — End: 2023-08-01

## 2023-01-16 MED ORDER — LISINOPRIL 20 MG PO TABS
20.0000 mg | ORAL_TABLET | Freq: Every day | ORAL | 1 refills | Status: DC
Start: 2023-01-16 — End: 2023-08-01

## 2023-01-16 NOTE — Assessment & Plan Note (Signed)
Chronic anemia is downtrending at last visit, ferritin elevated.  Encouraged patient to follow-up with hematology.  Patient does not desire blood transfusion, however I want her to see hematology in case they have other recommendations to help improve her anemia.

## 2023-01-16 NOTE — Progress Notes (Signed)
    SUBJECTIVE:   CHIEF COMPLAINT / HPI:   Katie Andrews is a 72yo F w/ hx of HTN, Hgb H, bradycardia that p/f HTN f/u.  HTN  Bradycardia - Stopped taking Losartan because it cause her heart to "flutter". Stopped about 2 weeks ago.  - Still taking lasix.  - has not yet heard back from Cardiology  Anemia  Hgb H -Patient is hesitant to see hematology because she does not want to get a blood transfusion (due to personal preference). -Discussed that her hemoglobin is slowly downtrending and so would recommend hematology follow-up.  Discussed that she can always deny blood transfusion.  Patient is agreeable to going now.  OBJECTIVE:   BP (!) 190/49   Pulse (!) 46   Ht 5\' 3"  (1.6 m)   Wt 248 lb 9.6 oz (112.8 kg)   SpO2 99%   BMI 44.04 kg/m   General: Alert, pleasant woman. NAD. HEENT: NCAT. MMM. CV: RRR, no murmurs.  Resp: CTAB, no wheezing or crackles. Normal WOB on RA.  Abm: Soft, nontender, nondistended.  Ext: Moves all ext spontaneously Skin: Warm, well perfused   ASSESSMENT/PLAN:   HYPERTENSION, BENIGN ESSENTIAL Uncontrolled.  Stopped taking losartan because she was having "palpitations".  Has been taking Lasix.  Reports that her lower extremity edema is improving.  Will switch pt to lisinopril 20mg  daily and add amlodipine 5mg  daily. - F/u w/ Dr. 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  Encouraged patient to follow-up with hematology.  Patient does not desire blood transfusion, however I want her to see hematology in case they have other recommendations to help improve her anemia.   Lincoln Brigham, MD [facility]

## 2023-01-16 NOTE — Assessment & Plan Note (Signed)
Uncontrolled.  Stopped taking losartan because she was having "palpitations".  Has been taking Lasix.  Reports that her lower extremity edema is improving.  Will switch pt to lisinopril 20mg  daily and add amlodipine 5mg  daily. - F/u w/ Dr. Raymondo Band for further HTN management - Encouraged to f/u with Cardiology

## 2023-01-16 NOTE — Patient Instructions (Addendum)
Good to see you today - Thank you for coming in  Things we discussed today:  1) Your blood pressure was high today!  - Start taking Lisinopril 20mg  once a day - Start taking Amlodipine 5mg  once a day - Expect a phone call from the Cardiologist in the next few days   2) For your anemia, it has been getting worse since your last visits.  - Expect a call from the Hematologist in the next few days so we can get their recommendations  Please always bring your medication bottles  Come back to see Dr. Raymondo Band in 1-2 weeks to follow-up on your blood pressure.

## 2023-01-25 ENCOUNTER — Telehealth: Payer: Self-pay | Admitting: Physician Assistant

## 2023-01-25 NOTE — Telephone Encounter (Signed)
Left a message on both home and mobile contacts regarding upcoming appointment times/dates.

## 2023-01-29 ENCOUNTER — Ambulatory Visit
Admission: RE | Admit: 2023-01-29 | Discharge: 2023-01-29 | Disposition: A | Discharge status: Home / Self Care | Payer: 59 | Source: Ambulatory Visit | Attending: Internal Medicine | Admitting: Internal Medicine

## 2023-01-29 DIAGNOSIS — Z1231 Encounter for screening mammogram for malignant neoplasm of breast: Secondary | ICD-10-CM

## 2023-01-30 ENCOUNTER — Ambulatory Visit (INDEPENDENT_AMBULATORY_CARE_PROVIDER_SITE_OTHER): Payer: 59 | Admitting: Pharmacist

## 2023-01-30 ENCOUNTER — Encounter: Payer: Self-pay | Admitting: Pharmacist

## 2023-01-30 VITALS — BP 141/51 | HR 55 | Ht 63.0 in | Wt 244.8 lb

## 2023-01-30 DIAGNOSIS — I10 Essential (primary) hypertension: Secondary | ICD-10-CM | POA: Diagnosis not present

## 2023-01-30 DIAGNOSIS — R609 Edema, unspecified: Secondary | ICD-10-CM

## 2023-01-30 NOTE — Assessment & Plan Note (Signed)
Discussed dependent edema/vascular insufficiency.  Encouraged patient to elevate her legs during the day if possible to help reduce "dependent edema".  She will try to elevate her legs to a level higher than her heart multiple times per day prior to next visit.  -Continue furosemide at this time.

## 2023-01-30 NOTE — Progress Notes (Signed)
S:     Chief Complaint  Patient presents with   Medication Management    Hypertension   72 y.o. female who presents for hypertension evaluation, education, and management.  PMH is significant for long-standing Hypertension.  Patient was referred and last seen by Primary Care Provider, Dr. Sherrilee Gilles, on 01/16/2023.    At last visit, amlodipine was initiated and losartan was switched to lisinopril.   Today, patient arrives in good spirits and presents with assistance. Denies routine dizziness, headache, blurred vision, swelling.  However reports dizziness with change of body position which has been going on for "several years"  Patient reports hypertension was diagnosed in 2009.   Medication adherence appears good . Patient has taken BP medications today.   Current antihypertensives include: Lisinopril 20mg  daily, amlodipine 5mg  daily and furosemide 20mg  daily  Antihypertensives tried in the past include: losartan, hydrochlorothiazide,   Reported home BP readings: Systolic readings 160-190 with lower diastolics of 50-80  Patient-reported exercise habits: limited.  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  All other systems reviewed and are negative.   Physical Exam Constitutional:      Appearance: Normal appearance.  Pulmonary:     Effort: Pulmonary effort is normal.  Musculoskeletal:     Right lower leg: Edema (2+) present.     Left lower leg: Edema (2+) present.  Neurological:     Mental Status: She is alert.   Edema in legs bilaterally was noted to be improved by the patient.   Last 3 Office BP readings: BP Readings from Last 3 Encounters:  01/30/23 (!) 141/51  01/16/23 (!) 190/49  01/02/23 (!) 182/70    BMET    Component Value Date/Time   NA 139 12/31/2022 1149   K 3.9 12/31/2022 1149   CL 105 12/31/2022 1149    CO2 21 12/31/2022 1149   GLUCOSE 88 12/31/2022 1149   GLUCOSE 99 06/04/2022 1203   BUN 8 12/31/2022 1149   CREATININE 0.70 12/31/2022 1149   CREATININE 0.87 06/04/2022 1203   CREATININE 0.83 03/02/2016 1007   CALCIUM 8.6 (L) 12/31/2022 1149   GFRNONAA >60 06/04/2022 1203   GFRNONAA 74 03/02/2016 1007   GFRAA 85 05/17/2020 1343   GFRAA 85 03/02/2016 1007    A/P: Hypertension diagnosed 2009 and currently much improved overall control with in-office assessment on current medications. BP goal < 140 mmHg systolic while minimizing orthostatic dizziness.  Medication adherence appears good.  Patient was surprised at improvement in readings.  Possible anxiety-induced elevations in blood pressure checks (at home and in-office).  Second and Third readings today were better than first reading and near goal.  -Continued Lisinopril 20mg  daily, amlodipine 5mg  daily and furosemide 20mg  daily  -Patient educated on purpose, proper use, and potential adverse effects.  -Counseled on lifestyle modifications for blood pressure control including reduced dietary sodium, increased exercise, adequate sleep. -Plan to perform ambulatory BP assessment to reassure patient of improvement in control of current regimen.  Scheduled for assessment in 1 week.  Results reviewed and written information provided.    Discussed dependent edema/vascular insufficiency.  Encouraged patient to elevate her legs during the day if possible to help reduce "dependent edema".  She will try to elevate her legs to a level higher than her heart multiple times per day prior to next visit.  -Continue furosemide at  this time.   Written patient instructions provided. Patient verbalized understanding of treatment plan.  Total time in face to face counseling 27 minutes.    Follow-up:  Pharmacist 1 week. PCP clinic visit in planned following Amb BP evaluation.

## 2023-01-30 NOTE — Assessment & Plan Note (Signed)
Hypertension diagnosed 2009 and currently much improved overall control with in-office assessment on current medications. BP goal < 140 mmHg systolic while minimizing orthostatic dizziness.  Medication adherence appears good.  Patient was surprised at improvement in readings.  Possible anxiety-induced elevations in blood pressure checks (at home and in-office).  Second and Third readings today were better than first reading and near goal.  -Continued Lisinopril 20mg  daily, amlodipine 5mg  daily and furosemide 20mg  daily  -Patient educated on purpose, proper use, and potential adverse effects.  -Counseled on lifestyle modifications for blood pressure control including reduced dietary sodium, increased exercise, adequate sleep. -Plan to perform ambulatory BP assessment to reassure patient of improvement in control of current regimen.  Scheduled for assessment in 1 week.  Results reviewed and written information provided.

## 2023-01-30 NOTE — Patient Instructions (Signed)
It was nice to see you today!  Your goal blood pressure is 140 mmHg if possible without worsening dizziness.  We are likely trying to lower your top blood pressure number to a lower number without worsening your dizziness.   It will be important for you to remain active and try to elevate your legs if possible to a level above your heart during the day.  This should help with minimizing fluid collection in your legs and around your hear.   Medication Changes:  Continue all other medication the same. Amlodipine 5mg  daily, Lisinopril 20mg  daily, Furosemide 20mg  daily   Monitor blood pressure at home daily and keep a log (on your phone or piece of paper) to bring with you to your next visit. Write down date, time, blood pressure and pulse.  Keep up the good work with diet and exercise. Aim for a diet full of vegetables, fruit and lean meats (chicken, [place], fish). Try to limit salt intake by eating fresh or frozen vegetables (instead of canned), rinse canned vegetables prior to cooking and do not add any additional salt to meals.

## 2023-01-31 ENCOUNTER — Other Ambulatory Visit: Payer: Self-pay | Admitting: Internal Medicine

## 2023-01-31 DIAGNOSIS — R928 Other abnormal and inconclusive findings on diagnostic imaging of breast: Secondary | ICD-10-CM

## 2023-02-01 NOTE — Progress Notes (Signed)
Reviewed and agree with Dr Koval's plan.   

## 2023-02-06 ENCOUNTER — Ambulatory Visit (INDEPENDENT_AMBULATORY_CARE_PROVIDER_SITE_OTHER): Payer: 59 | Admitting: Pharmacist

## 2023-02-06 ENCOUNTER — Encounter: Payer: Self-pay | Admitting: Pharmacist

## 2023-02-06 VITALS — BP 185/103 | Wt 243.2 lb

## 2023-02-06 DIAGNOSIS — I10 Essential (primary) hypertension: Secondary | ICD-10-CM | POA: Diagnosis not present

## 2023-02-06 NOTE — Assessment & Plan Note (Signed)
History of hypertension longstanding since 2009 years currently taking amlodipine, lisinopril and furosemide daily.  Goal presssure of <140 mmHg systolic or possibly < 130 mmHg if able to tolerate.      -Placed blood pressure cuff, provided education, patient instructed to wear cuff for 24 hours and return tomorrow to review results.

## 2023-02-06 NOTE — Patient Instructions (Signed)
Blood Pressure Activity Diary Time Lying down/ Sleeping Walking/ Exercise Stressed/ Angry Headache/ Pain Dizzy  9 AM       10 AM       11 AM       12 PM       1 PM       2 PM       Time Lying down/ Sleeping Walking/ Exercise Stressed/ Angry Headache/ Pain Dizzy  3 PM       4 PM        5 PM       6 PM       7 PM       8 PM       Time Lying down/ Sleeping Walking/ Exercise Stressed/ Angry Headache/ Pain Dizzy  9 PM       10 PM       11 PM       12 AM       1 AM       2 AM       3 AM       Time Lying down/ Sleeping Walking/ Exercise Stressed/ Angry Headache/ Pain Dizzy  4 AM       5 AM       6 AM       7 AM       8 AM       9 AM       10 AM        Time you woke up: _________                  Time you went to sleep:__________  Come back tomorrow at 8:30 to have the monitor removed  Call the [facility] if you have any questions before then ([phone])  Wearing the Blood Pressure Monitor The cuff will inflate every 20 minutes during the day and every 30 minutes while you sleep. Fill out the blood pressure-activity diary during the day, especially during activities that may affect your reading -- such as exercise, stress, walking, taking your blood pressure medications  Important things to know: Avoid taking the monitor off for the next 24 hours, unless it causes you discomfort or pain. Do NOT get the monitor wet and do NOT try to clean the monitor with any cleaning products. Do NOT put the monitor on anyone else's arm. When the cuff inflates, avoid excess movement. Let the cuffed arm hang loosely, slightly away from the body. Avoid flexing the muscles or moving the hand/fingers. Remember to fill out the blood pressure activity diary. If you experience severe pain or unusual pain (not associated with getting your blood pressure checked), remove the monitor.  Troubleshooting:  Code  Troubleshooting   1  Check cuff position, tighten cuff   2, 3  Remain still  during reading   4, 87  Check air hose connections and make sure cuff is tight   85, 89  Check hose connections and make tubing is not crimped   86  Push START/STOP to restart reading   88, 91  Retry by pushing START/STOP   90  Replace batteries. If problem persists, remove monitor and bring back to   clinic at follow up   97, 98, 99  Service required - Remove monitor and bring back to clinic at follow up

## 2023-02-06 NOTE — Progress Notes (Addendum)
S:     Chief Complaint  Patient presents with   Medication Management    Amb BP - Day1   72 y.o. female who presents for hypertension evaluation, education, and management.   PMH is significant for long-standing Hypertension.  Patient was referred and last seen by Primary Care Provider, Dr. Sherrilee Gilles, on 01/16/2023.Marland Kitchen  At last visit, plan was for home monitoring and ambulatory pressure monitoring to assess control.   Diagnosed with Hypertension in the year of 2009.    Medication compliance is reported to be good.  Discussed procedure for wearing the monitor and gave patient written instructions. Monitor was placed on non-dominant arm with instructions to return in the morning.   Current antihypertensives include: Lisinopril 20mg  daily, amlodipine 5mg  daily and furosemide 20mg  daily   Antihypertensives tried in the past include: losartan, hydrochlorothiazide,  Patient  brought in multiple home readings which are improved.  Home readings over the last 4 days include readings: Systolic 124,125, 126, 142 and 178 Diastolic 49, 51, 58, 60 and 74  O:  Review of Systems  Cardiovascular:  Positive for leg swelling.  All other systems reviewed and are negative.   Physical Exam Vitals reviewed.  Pulmonary:     Effort: Pulmonary effort is normal.  Neurological:     Mental Status: She is alert.  Psychiatric:        Mood and Affect: Mood normal.        Thought Content: Thought content normal.    Last 3 Office BP readings: BP Readings from Last 3 Encounters:  02/06/23 (!) 185/103  01/30/23 (!) 141/51  01/16/23 (!) 190/49    Clinical Atherosclerotic Cardiovascular Disease (ASCVD):  The 10-year ASCVD risk score (Arnett DK, et al., 2019) is: 23.4%   Values used to calculate the score:     Age: 28 years     Sex: Female     Is Non-Hispanic African American: Yes     Diabetic: No     Tobacco smoker: No     Systolic Blood Pressure: 185 mmHg     Is BP treated: Yes     HDL  Cholesterol: 63 mg/dL     Total Cholesterol: 183 mg/dL  Basic Metabolic Panel    Component Value Date/Time   NA 139 12/31/2022 1149   K 3.9 12/31/2022 1149   CL 105 12/31/2022 1149   CO2 21 12/31/2022 1149   GLUCOSE 88 12/31/2022 1149   GLUCOSE 99 06/04/2022 1203   BUN 8 12/31/2022 1149   CREATININE 0.70 12/31/2022 1149   CREATININE 0.87 06/04/2022 1203   CREATININE 0.83 03/02/2016 1007   CALCIUM 8.6 (L) 12/31/2022 1149   GFRNONAA >60 06/04/2022 1203   GFRNONAA 74 03/02/2016 1007   GFRAA 85 05/17/2020 1343   GFRAA 85 03/02/2016 1007    Renal function: CrCl cannot be calculated (Patient's most recent lab result is older than the maximum 21 days allowed.).   ABPM Study Data: Arm Placement left arm  For Office Goal Goal BP of <140 or 130 if she can tolerate ABPM thresholds: Overall BP < 125, daytime BP <130 mmHg, sleeptime BP <110 mmHg    A/P: History of hypertension longstanding since 2009 years currently taking amlodipine, lisinopril and furosemide daily.  Goal presssure of <140 mmHg systolic or possibly < 130 mmHg if able to tolerate.      -Placed blood pressure cuff, provided education, patient instructed to wear cuff for 24 hours and return tomorrow to review results.   Written  patient instructions provided including activity/symptom/event log. Patient verbalized understanding of plan. Total time in face to face counseling 14 minutes.    Follow-up: Tomorrow AM - early morning appointment 8:30  Patient seen with Andee Poles, PharmD Candidate.

## 2023-02-07 ENCOUNTER — Ambulatory Visit: Payer: 59 | Admitting: Pharmacist

## 2023-02-07 NOTE — Progress Notes (Signed)
Reviewed and agree with Dr Koval's plan.   

## 2023-02-08 ENCOUNTER — Encounter: Payer: Self-pay | Admitting: Pharmacist

## 2023-02-08 ENCOUNTER — Ambulatory Visit (INDEPENDENT_AMBULATORY_CARE_PROVIDER_SITE_OTHER): Payer: 59 | Admitting: Pharmacist

## 2023-02-08 VITALS — BP 144/54

## 2023-02-08 DIAGNOSIS — I10 Essential (primary) hypertension: Secondary | ICD-10-CM

## 2023-02-08 NOTE — Progress Notes (Signed)
Reviewed and agree with Dr Koval's plan.   

## 2023-02-08 NOTE — Progress Notes (Signed)
S:     Chief Complaint  Patient presents with   Medication Management    Amb BP Monitor - return and results review   72 y.o. female who presents for hypertension evaluation, education, and management.  PMH is significant for long-standing Hypertension and Bradycardia.  Patient was referred and last seen by Primary Care Provider, Dr. Sherrilee Gilles, on 01/16/2023.   Current antihypertensives include: Lisinopril 20mg  daily, amlodipine 5mg  daily and furosemide 20mg  daily    Antihypertensives tried in the past include: losartan, hydrochlorothiazide   O:  Review of Systems  Cardiovascular:  Positive for leg swelling (reports slight improvement).  Neurological:  Negative for dizziness.  All other systems reviewed and are negative.   Physical Exam Constitutional:      Appearance: Normal appearance.  Pulmonary:     Effort: Pulmonary effort is normal.  Musculoskeletal:     Right lower leg: Edema present.     Left lower leg: Edema present.  Neurological:     Mental Status: She is alert.  Psychiatric:        Mood and Affect: Mood normal.        Behavior: Behavior normal.        Thought Content: Thought content normal.        Judgment: Judgment normal.     Last 3 Office BP readings: BP Readings from Last 3 Encounters:  02/06/23 (!) 185/103  01/30/23 (!) 141/51  01/16/23 (!) 190/49    Clinical Atherosclerotic Cardiovascular Disease (ASCVD): No  The 10-year ASCVD risk score (Arnett DK, et al., 2019) is: 23.4%   Values used to calculate the score:     Age: 78 years     Sex: Female     Is Non-Hispanic African American: Yes     Diabetic: No     Tobacco smoker: No     Systolic Blood Pressure: 185 mmHg     Is BP treated: Yes     HDL Cholesterol: 63 mg/dL     Total Cholesterol: 183 mg/dL  Basic Metabolic Panel    Component Value Date/Time   NA 139 12/31/2022 1149   K 3.9 12/31/2022 1149   CL 105 12/31/2022 1149   CO2 21 12/31/2022 1149   GLUCOSE 88 12/31/2022 1149    GLUCOSE 99 06/04/2022 1203   BUN 8 12/31/2022 1149   CREATININE 0.70 12/31/2022 1149   CREATININE 0.87 06/04/2022 1203   CREATININE 0.83 03/02/2016 1007   CALCIUM 8.6 (L) 12/31/2022 1149   GFRNONAA >60 06/04/2022 1203   GFRNONAA 74 03/02/2016 1007   GFRAA 85 05/17/2020 1343   GFRAA 85 03/02/2016 1007    Renal function: CrCl cannot be calculated (Patient's most recent lab result is older than the maximum 21 days allowed.).   ABPM Study Data: Arm Placement left arm  Overall Mean 24hr BP:   144/54 mmHg  HR: 50  Daytime Mean BP:  144/54 mmHg  HR: 50  Nighttime Mean BP:  Not obtained   For Office Goal Goal BP of <140 or 130 if she can tolerate ABPM thresholds: Overall BP < 125, daytime BP <130 mmHg, sleeptime BP <110 mmHg     A/P: History of hypertension longstanding since 2009 and  currently taking amlodipine, lisinopril and furosemide daily.  Goal presssure of <140 mmHg systolic or possibly < 130 mmHg if able to tolerate.  Found to have isolated systolic hypertension with 24-hour ambulatory blood pressure evaluation which demonstrates an average AWAKE blood pressure of 144/54 mmHg.  Changes to  medications -Continued current regimen - continue to reassess tolerability of low diastolic in the future.  -Asked to continue home monitoring as her home monitor results appear consistent to our Amb BP measurements.   Results reviewed and written information provided.    Written patient instructions provided. Patient verbalized understanding of treatment plan.  Total time in face to face counseling 18 minutes.    Follow-up:  Pharmacist PRN PCP clinic visit in 2-3 months as needed - no acute follow-up from results required.  Patient seen with Andee Poles, PharmD Candidate.

## 2023-02-08 NOTE — Assessment & Plan Note (Signed)
History of hypertension longstanding since 2009 and  currently taking amlodipine, lisinopril and furosemide daily.  Goal presssure of <140 mmHg systolic or possibly < 130 mmHg if able to tolerate.  Found to have isolated systolic hypertension with 24-hour ambulatory blood pressure evaluation which demonstrates an average AWAKE blood pressure of 144/54 mmHg.  Changes to medications -Continued current regimen - continue to reassess tolerability of low diastolic in the future.  -Asked to continue home monitoring as her home monitor results appear consistent to our Amb BP measurements.

## 2023-02-08 NOTE — Patient Instructions (Signed)
It was nice to see you today!  Thank you for completing the blood pressure monitoring evaluation.  Your goal blood pressure is < for your top number.  Medication Changes: Continue all medication the same.   Monitor blood pressure at home and keep a log (on a piece of paper) to bring with you to your next visit.

## 2023-02-12 ENCOUNTER — Telehealth: Payer: Self-pay

## 2023-02-12 DIAGNOSIS — R928 Other abnormal and inconclusive findings on diagnostic imaging of breast: Secondary | ICD-10-CM

## 2023-02-12 NOTE — Telephone Encounter (Signed)
[facility] calling asking for PCP to sign order for pt appt tomorrow. They need this ASAP! Please let me know when this is done so I can call and make sure they have received the signed order.  Clemencia Course, CMA

## 2023-02-13 ENCOUNTER — Ambulatory Visit: Admission: RE | Admit: 2023-02-13 | Payer: 59 | Source: Ambulatory Visit

## 2023-02-13 ENCOUNTER — Ambulatory Visit
Admission: RE | Admit: 2023-02-13 | Discharge: 2023-02-13 | Disposition: A | Discharge status: Home / Self Care | Payer: 59 | Source: Ambulatory Visit | Attending: Internal Medicine | Admitting: Internal Medicine

## 2023-02-13 DIAGNOSIS — R928 Other abnormal and inconclusive findings on diagnostic imaging of breast: Secondary | ICD-10-CM

## 2023-02-14 ENCOUNTER — Other Ambulatory Visit: Payer: Self-pay | Admitting: Physician Assistant

## 2023-02-14 DIAGNOSIS — D649 Anemia, unspecified: Secondary | ICD-10-CM

## 2023-02-15 ENCOUNTER — Inpatient Hospital Stay: Payer: 59 | Admitting: Physician Assistant

## 2023-02-15 ENCOUNTER — Inpatient Hospital Stay: Payer: 59 | Attending: Physician Assistant

## 2023-03-18 ENCOUNTER — Ambulatory Visit: Payer: 59 | Attending: Cardiovascular Disease | Admitting: Cardiovascular Disease

## 2023-04-15 ENCOUNTER — Other Ambulatory Visit: Payer: Self-pay | Admitting: Family Medicine

## 2023-04-15 DIAGNOSIS — E669 Obesity, unspecified: Secondary | ICD-10-CM

## 2023-05-08 IMAGING — DX DG CHEST 1V PORT
1 series · 1 of 1 positions shown · non-contrast
Comparison: No priors.

CLINICAL DATA: 70-year-old female with history of fever.

EXAM:
PORTABLE CHEST 1 VIEW

[chest ap]
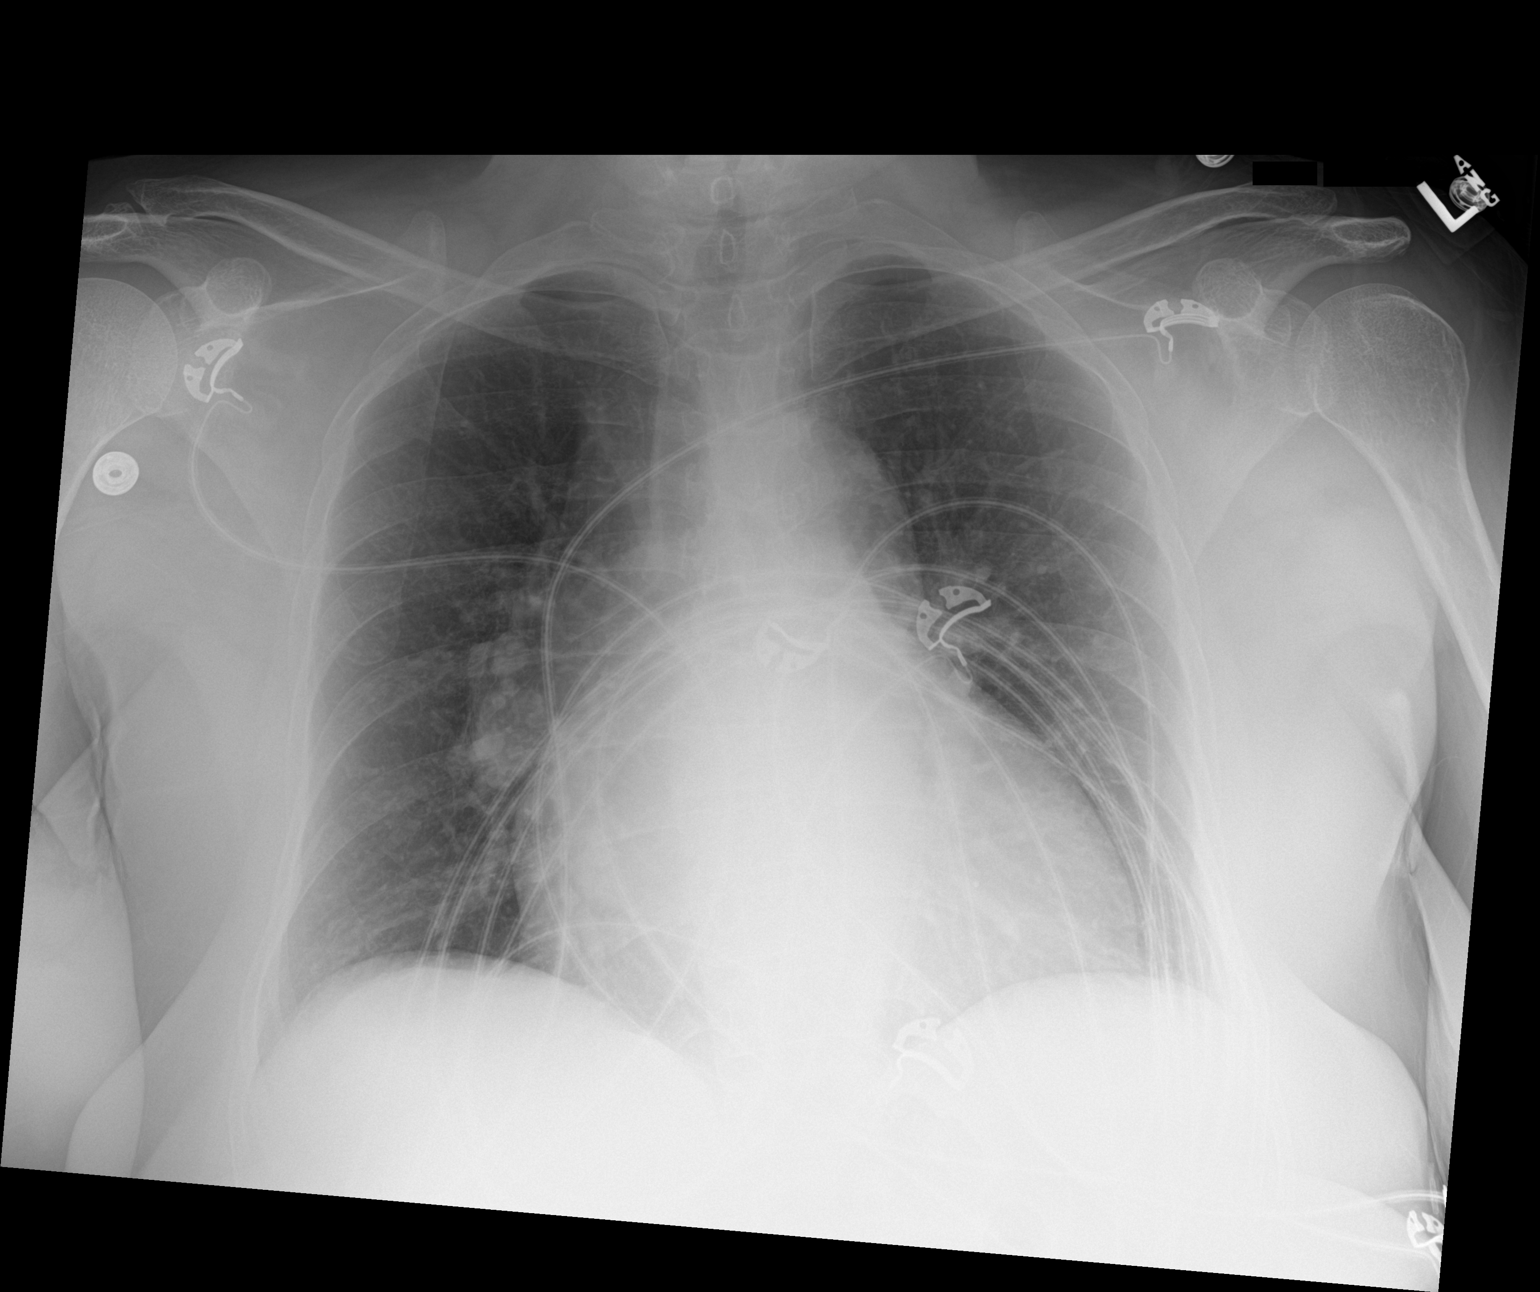

[1 of 1 positions shown; findings below may reference images not displayed]

FINDINGS: Lung volumes are normal. No consolidative airspace disease. No
pleural effusions. No pneumothorax. No evidence of pulmonary edema.
Heart size is moderately enlarged. Upper mediastinal contours are
within normal limits.
IMPRESSION: 1. Cardiomegaly without other radiographic evidence of acute
cardiopulmonary disease.

## 2023-07-22 ENCOUNTER — Other Ambulatory Visit: Payer: Self-pay | Admitting: Hematology

## 2023-07-22 DIAGNOSIS — I10 Essential (primary) hypertension: Secondary | ICD-10-CM

## 2023-08-01 ENCOUNTER — Other Ambulatory Visit: Payer: Self-pay | Admitting: Family Medicine

## 2023-08-01 ENCOUNTER — Other Ambulatory Visit: Payer: Self-pay

## 2023-08-01 DIAGNOSIS — I10 Essential (primary) hypertension: Secondary | ICD-10-CM

## 2023-08-01 MED ORDER — AMLODIPINE BESYLATE 5 MG PO TABS
5.0000 mg | ORAL_TABLET | Freq: Every day | ORAL | 1 refills | Status: DC
Start: 2023-08-01 — End: 2023-08-02

## 2023-08-01 MED ORDER — LISINOPRIL 20 MG PO TABS
20.0000 mg | ORAL_TABLET | Freq: Every day | ORAL | 1 refills | Status: DC
Start: 2023-08-01 — End: 2023-08-02

## 2023-08-09 ENCOUNTER — Other Ambulatory Visit: Payer: Self-pay | Admitting: Family Medicine

## 2023-08-09 ENCOUNTER — Other Ambulatory Visit: Payer: Self-pay

## 2023-08-09 DIAGNOSIS — I10 Essential (primary) hypertension: Secondary | ICD-10-CM

## 2023-09-26 NOTE — Telephone Encounter (Signed)
error 

## 2023-09-26 NOTE — Telephone Encounter (Signed)
 erro

## 2023-10-21 ENCOUNTER — Other Ambulatory Visit: Payer: Self-pay | Admitting: Family Medicine

## 2023-10-21 DIAGNOSIS — I10 Essential (primary) hypertension: Secondary | ICD-10-CM

## 2023-10-29 ENCOUNTER — Other Ambulatory Visit: Payer: Self-pay

## 2023-10-29 DIAGNOSIS — I10 Essential (primary) hypertension: Secondary | ICD-10-CM

## 2023-10-30 MED ORDER — FUROSEMIDE 20 MG PO TABS: 20.0000 mg | ORAL_TABLET | Freq: Every day | ORAL | 3 refills | Status: AC

## 2023-12-31 ENCOUNTER — Other Ambulatory Visit: Payer: Self-pay | Admitting: Internal Medicine

## 2023-12-31 DIAGNOSIS — Z1231 Encounter for screening mammogram for malignant neoplasm of breast: Secondary | ICD-10-CM

## 2024-02-14 ENCOUNTER — Ambulatory Visit
Admission: RE | Admit: 2024-02-14 | Discharge: 2024-02-14 | Disposition: A | Discharge status: Home / Self Care | Source: Ambulatory Visit | Attending: Internal Medicine | Admitting: Internal Medicine

## 2024-02-14 DIAGNOSIS — Z1231 Encounter for screening mammogram for malignant neoplasm of breast: Secondary | ICD-10-CM

## 2024-06-23 ENCOUNTER — Encounter: Payer: Self-pay | Admitting: Family Medicine

## 2024-06-23 ENCOUNTER — Ambulatory Visit: Admitting: Family Medicine

## 2024-06-23 VITALS — BP 202/60 | HR 64 | Ht 63.0 in | Wt 232.0 lb

## 2024-06-23 DIAGNOSIS — R634 Abnormal weight loss: Secondary | ICD-10-CM | POA: Diagnosis not present

## 2024-06-23 DIAGNOSIS — I10 Essential (primary) hypertension: Secondary | ICD-10-CM

## 2024-06-23 LAB — POCT URINE PREGNANCY: Preg Test, Ur: NEGATIVE

## 2024-06-23 NOTE — Progress Notes (Signed)
" ° ° °  SUBJECTIVE:   CHIEF COMPLAINT / HPI:    Katie Andrews is a 73yo F that pf weight loss - Reports eating salt this morning andf that's why her BP was high - For about past 6 months, has felt weaker and losing weight - She did move about 6 months ago, she has been stressed about this. Her upstairs neighbor is stressing her out by causing a lot of noise.  - Feels generally more weak and lower energy - Feels like she has loss of appetite. She wonders if this is related to her age. She feels like she doesn't want to prepare food.  - Denies food insecurity. She has food at home, just doesn't want to make it.  - Denies blood in stool, denies melena - Reports hair texture change too - Works out twice a week   PERTINENT  PMH / PSH: Hgb H, HTN  OBJECTIVE:   BP (!) 202/60   Pulse 64   Ht 5' 3 (1.6 m)   Wt 232 lb (105.2 kg)   SpO2 96%   BMI 41.10 kg/m   General: Alert, pleasant woman. NAD. HEENT: NCAT. MMM. CV: RRR, no murmurs.   Resp: CTAB, no wheezing or crackles. Normal WOB on RA.   Ext: Moves all ext spontaneously Skin: Warm, well perfused   ASSESSMENT/PLAN:   Assessment & Plan Weight loss 11lb weight loss over past yr. Differential includes worsening of underlying anemia, hypothyroidism, malignancy. Will check BMP, CBC, and TSH. PT is UTD on cancer screenings. - TSH, CBC, BMP - iron studies Primary hypertension Above goal, likely due to increase salt intake. Discussed options and pt would like to f/u. Prior echo 1 yr ago wnl and stable. No new murmurs today. Will f/u BMP as above.  - f/u in 2-4 wks     Twyla Nearing, MD [facility] "

## 2024-06-23 NOTE — Patient Instructions (Addendum)
"   1) I do not think your medications are causing your weight loss. I will check a few labs today to see if something else is causing your weight loss.  2) I am most worried about your anemia. He may be getting slightly worse and leading to your weight loss. If your hemoglobin is low again, we may need to send you to see a hematologist.   Come back in 1 month and we will check to see if you are still losing weight.  "

## 2024-06-24 LAB — CBC
Hematocrit: 33.9 % — ABNORMAL LOW (ref 34.0–46.6)
Hemoglobin: 9.4 g/dL — ABNORMAL LOW (ref 11.1–15.9)
MCH: 16.9 pg — ABNORMAL LOW (ref 26.6–33.0)
MCHC: 27.7 g/dL — ABNORMAL LOW (ref 31.5–35.7)
MCV: 61 fL — ABNORMAL LOW (ref 79–97)
Platelets: 144 x10E3/uL — ABNORMAL LOW (ref 150–450)
RBC: 5.55 x10E6/uL — ABNORMAL HIGH (ref 3.77–5.28)
RDW: 26.5 % — ABNORMAL HIGH (ref 11.7–15.4)
WBC: 4.3 x10E3/uL (ref 3.4–10.8)

## 2024-06-24 LAB — BASIC METABOLIC PANEL WITH GFR
BUN/Creatinine Ratio: 18 (ref 12–28)
BUN: 12 mg/dL (ref 8–27)
CO2: 23 mmol/L (ref 20–29)
Calcium: 9.2 mg/dL (ref 8.7–10.3)
Chloride: 103 mmol/L (ref 96–106)
Creatinine, Ser: 0.68 mg/dL (ref 0.57–1.00)
Glucose: 100 mg/dL — ABNORMAL HIGH (ref 70–99)
Potassium: 3.9 mmol/L (ref 3.5–5.2)
Sodium: 138 mmol/L (ref 134–144)
eGFR: 91 mL/min/1.73

## 2024-06-24 LAB — IRON,TIBC AND FERRITIN PANEL
Ferritin: 299 ng/mL — ABNORMAL HIGH (ref 15–150)
Iron Saturation: 23 % (ref 15–55)
Iron: 43 ug/dL (ref 27–139)
Total Iron Binding Capacity: 183 ug/dL — ABNORMAL LOW (ref 250–450)
UIBC: 140 ug/dL (ref 118–369)

## 2024-06-24 LAB — TSH: TSH: 1.51 u[IU]/mL (ref 0.450–4.500)

## 2024-06-30 ENCOUNTER — Other Ambulatory Visit: Payer: Self-pay | Admitting: *Deleted

## 2024-06-30 DIAGNOSIS — E669 Obesity, unspecified: Secondary | ICD-10-CM

## 2024-07-08 MED ORDER — ROSUVASTATIN CALCIUM 20 MG PO TABS: 20.0000 mg | ORAL_TABLET | Freq: Every day | ORAL | 3 refills | Status: AC
